# Patient Record
Sex: Female | Born: 1935 | Race: White | Hispanic: No | Smoking: Former smoker
Health system: Southern US, Community
[De-identification: ages and names within clinical notes are randomized; demographics above are authoritative.]

## PROBLEM LIST (undated history)

## (undated) DIAGNOSIS — R51 Headache: Secondary | ICD-10-CM

## (undated) DIAGNOSIS — M1990 Unspecified osteoarthritis, unspecified site: Secondary | ICD-10-CM

## (undated) DIAGNOSIS — I10 Essential (primary) hypertension: Secondary | ICD-10-CM

## (undated) DIAGNOSIS — R42 Dizziness and giddiness: Secondary | ICD-10-CM

## (undated) DIAGNOSIS — I714 Abdominal aortic aneurysm, without rupture, unspecified: Secondary | ICD-10-CM

## (undated) DIAGNOSIS — D689 Coagulation defect, unspecified: Secondary | ICD-10-CM

## (undated) DIAGNOSIS — E785 Hyperlipidemia, unspecified: Secondary | ICD-10-CM

## (undated) DIAGNOSIS — R630 Anorexia: Secondary | ICD-10-CM

## (undated) DIAGNOSIS — R197 Diarrhea, unspecified: Secondary | ICD-10-CM

## (undated) DIAGNOSIS — H269 Unspecified cataract: Secondary | ICD-10-CM

## (undated) HISTORY — DX: Hyperlipidemia, unspecified: E78.5

## (undated) HISTORY — DX: Coagulation defect, unspecified: D68.9

## (undated) HISTORY — DX: Abdominal aortic aneurysm, without rupture: I71.4

## (undated) HISTORY — DX: Unspecified cataract: H26.9

## (undated) HISTORY — DX: Unspecified osteoarthritis, unspecified site: M19.90

## (undated) HISTORY — PX: PR VEIN BYPASS GRAFT,AORTO-FEM-POP: 35551

## (undated) HISTORY — DX: Dizziness and giddiness: R42

## (undated) HISTORY — DX: Headache: R51

## (undated) HISTORY — DX: Anorexia: R63.0

## (undated) HISTORY — DX: Essential (primary) hypertension: I10

## (undated) HISTORY — PX: ARTERIOVENOUS GRAFT PLACEMENT: SUR1029

## (undated) HISTORY — DX: Abdominal aortic aneurysm, without rupture, unspecified: I71.40

## (undated) HISTORY — DX: Diarrhea, unspecified: R19.7

---

## 2010-07-09 ENCOUNTER — Inpatient Hospital Stay (HOSPITAL_COMMUNITY): Admission: EM | Admit: 2010-07-09 | Discharge: 2010-07-27 | Payer: Self-pay | Admitting: Emergency Medicine

## 2010-07-09 ENCOUNTER — Ambulatory Visit: Payer: Self-pay | Admitting: Cardiothoracic Surgery

## 2010-07-09 ENCOUNTER — Encounter: Payer: Self-pay | Admitting: Cardiothoracic Surgery

## 2010-07-13 ENCOUNTER — Ambulatory Visit: Payer: Self-pay | Admitting: Physical Medicine & Rehabilitation

## 2010-07-18 ENCOUNTER — Encounter: Payer: Self-pay | Admitting: Cardiothoracic Surgery

## 2010-07-28 ENCOUNTER — Emergency Department (HOSPITAL_COMMUNITY)
Admission: EM | Admit: 2010-07-28 | Discharge: 2010-07-28 | Payer: Self-pay | Source: Home / Self Care | Admitting: Emergency Medicine

## 2010-08-01 ENCOUNTER — Inpatient Hospital Stay (HOSPITAL_COMMUNITY): Admission: EM | Admit: 2010-08-01 | Discharge: 2010-08-11 | Payer: Self-pay | Admitting: Emergency Medicine

## 2010-08-01 ENCOUNTER — Ambulatory Visit: Payer: Self-pay | Admitting: Cardiovascular Disease

## 2010-08-03 ENCOUNTER — Encounter: Payer: Self-pay | Admitting: Cardiothoracic Surgery

## 2010-08-04 ENCOUNTER — Ambulatory Visit: Payer: Self-pay | Admitting: Cardiothoracic Surgery

## 2010-08-04 ENCOUNTER — Encounter: Payer: Self-pay | Admitting: Cardiothoracic Surgery

## 2010-08-06 ENCOUNTER — Encounter: Payer: Self-pay | Admitting: Cardiothoracic Surgery

## 2010-08-11 ENCOUNTER — Inpatient Hospital Stay: Admission: AD | Admit: 2010-08-11 | Discharge: 2010-09-05 | Payer: Self-pay | Admitting: Internal Medicine

## 2010-08-14 ENCOUNTER — Encounter (INDEPENDENT_AMBULATORY_CARE_PROVIDER_SITE_OTHER): Payer: Self-pay | Admitting: Internal Medicine

## 2010-09-25 ENCOUNTER — Ambulatory Visit: Payer: Self-pay | Admitting: Surgery

## 2010-09-25 ENCOUNTER — Encounter: Admission: RE | Admit: 2010-09-25 | Discharge: 2010-09-25 | Payer: Self-pay | Admitting: Surgery

## 2010-09-25 DIAGNOSIS — R197 Diarrhea, unspecified: Secondary | ICD-10-CM

## 2010-09-25 DIAGNOSIS — D689 Coagulation defect, unspecified: Secondary | ICD-10-CM

## 2010-09-25 DIAGNOSIS — R42 Dizziness and giddiness: Secondary | ICD-10-CM

## 2010-09-25 DIAGNOSIS — R51 Headache: Secondary | ICD-10-CM

## 2010-09-25 HISTORY — DX: Diarrhea, unspecified: R19.7

## 2010-09-25 HISTORY — DX: Headache: R51

## 2010-09-25 HISTORY — DX: Coagulation defect, unspecified: D68.9

## 2010-09-25 HISTORY — DX: Dizziness and giddiness: R42

## 2010-09-27 ENCOUNTER — Encounter: Admission: RE | Admit: 2010-09-27 | Discharge: 2010-09-27 | Payer: Self-pay | Admitting: Cardiothoracic Surgery

## 2010-09-27 ENCOUNTER — Ambulatory Visit: Payer: Self-pay | Admitting: Cardiothoracic Surgery

## 2010-10-03 ENCOUNTER — Ambulatory Visit: Payer: Self-pay | Admitting: Psychiatry

## 2010-10-07 ENCOUNTER — Ambulatory Visit: Payer: Self-pay | Admitting: Psychiatry

## 2010-10-25 ENCOUNTER — Encounter
Admission: RE | Admit: 2010-10-25 | Discharge: 2010-10-25 | Payer: Self-pay | Source: Home / Self Care | Admitting: Cardiothoracic Surgery

## 2010-10-25 ENCOUNTER — Ambulatory Visit: Payer: Self-pay | Admitting: Cardiothoracic Surgery

## 2010-10-30 ENCOUNTER — Ambulatory Visit: Payer: Self-pay | Admitting: Surgery

## 2010-12-09 ENCOUNTER — Encounter: Payer: Self-pay | Admitting: Surgery

## 2010-12-12 IMAGING — CR DG CHEST 1V PORT
1 series · 1 of 1 positions shown · non-contrast
Comparison: 1 day prior

CLINICAL DATA: Postop for thoracotomy.

PORTABLE CHEST - 1 VIEW

[view not recorded]
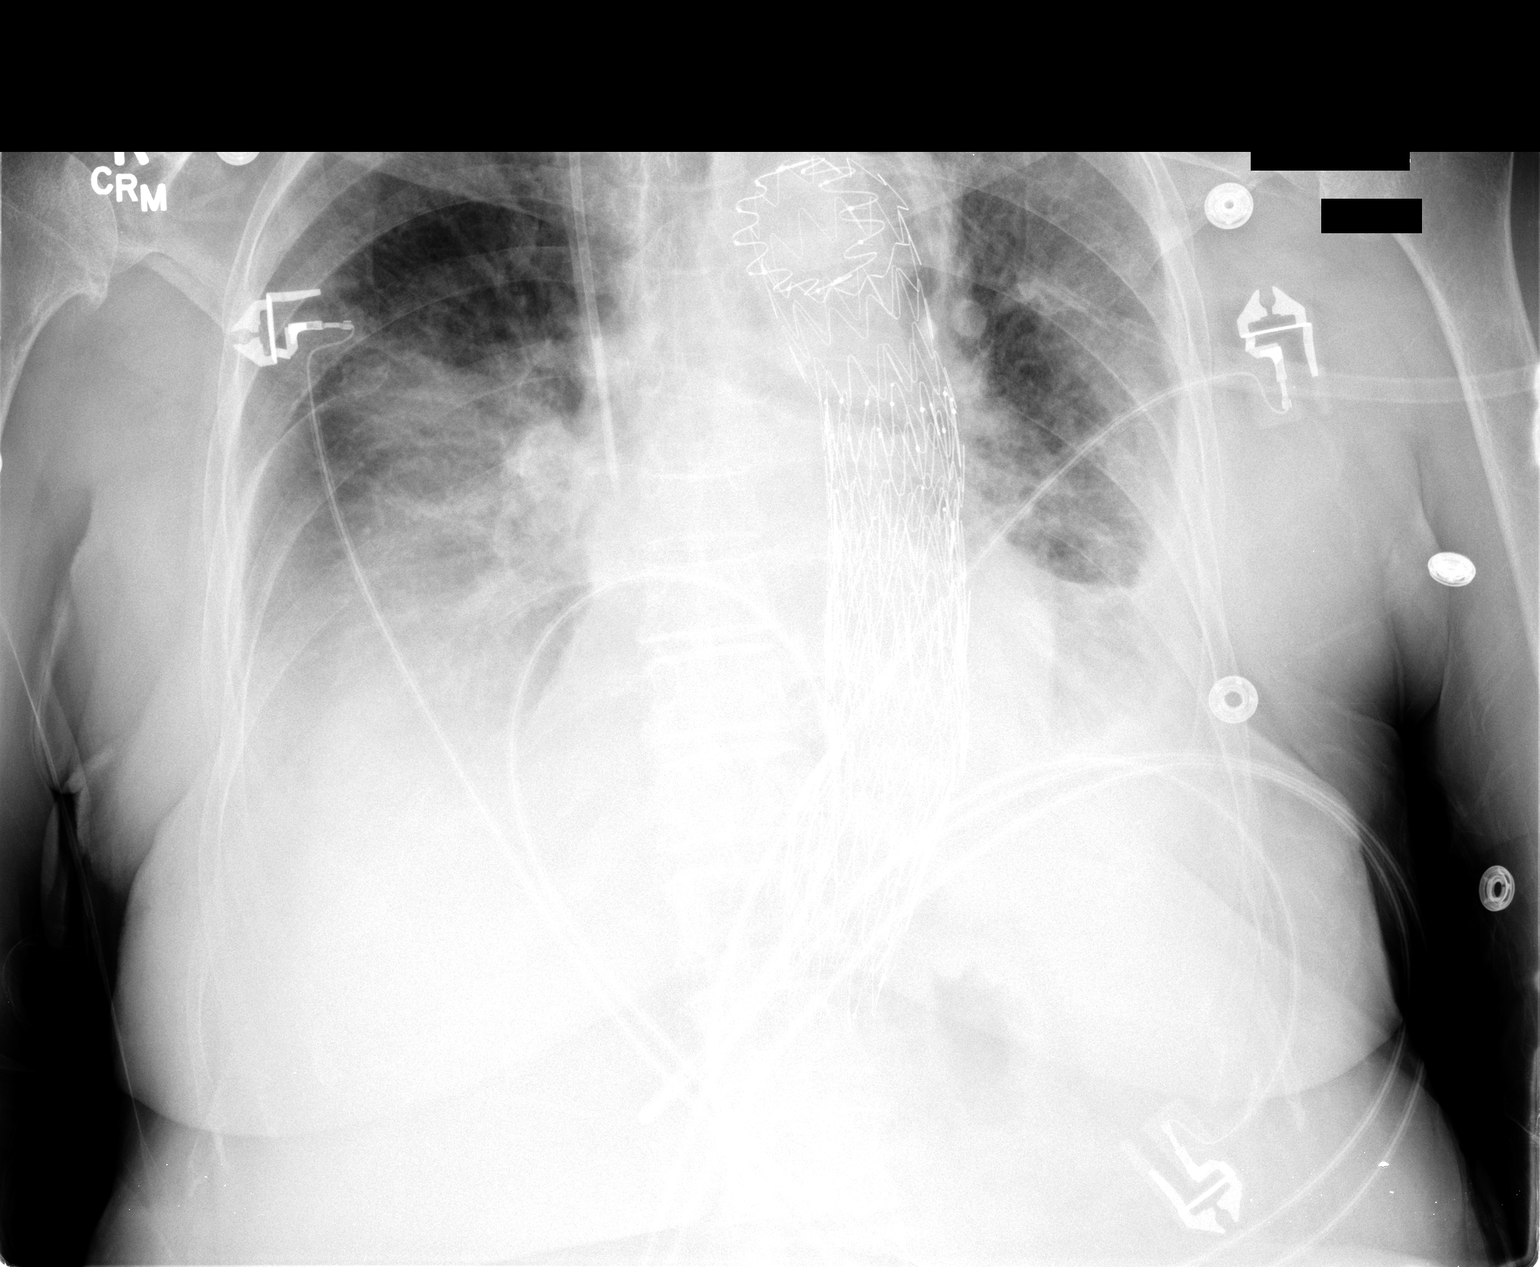

[1 of 1 positions shown; findings below may reference images not displayed]

FINDINGS: A right IJ central line is unchanged.  Descending
thoracic aortic stent graft repair.

The left apex is minimally excluded.  Mild cardiomegaly.  Layering
small bilateral pleural effusions are similar. No pneumothorax.
Asymmetric right greater than left interstitial edema.  Bibasilar
airspace disease. Similar to on the prior exam.
IMPRESSION: 1. No significant change since one day prior.
2.  Congestive heart failure with bilateral pleural effusions and
bibasilar airspace disease, likely atelectasis.

## 2011-01-28 IMAGING — CT CT ANGIO CHEST
2 of 7 series · 14 of 30 positions shown · IV contrast ([ID] OMNI [id number])
Comparison: CT chest of 07/31/2010 and CT angio chest abdomen
pelvis of 07/09/2010

CTA CHEST

Addendum Begins

This study was reviewed by phone with Dr. Nes.  Close  follow-
up CT angio chest may be helpful to be certain that the small area
of higher attenuation just posterior to the distal aortic arch
portion of the stent remains completely stable.
Addendum Ends
CLINICAL DATA: Ruptured thoracic aortic aneurysm, follow-up post
surgical repair
CT ANGIOGRAPHY CHEST, ABDOMEN AND PELVIS
TECHNIQUE: Multidetector CT imaging through the chest, abdomen and
pelvis was performed using the standard protocol during bolus
administration of intravenous contrast.  Multiplanar reconstructed
images including MIPs were obtained and reviewed to evaluate the
vascular anatomy.
Contrast: 100 ml 2mnipaque-MVV

[Series 5: angio · axial · 0.66mm/px · z∈[-568,-73]mm · 10 of 303 slices shown]
[im 28/303  lung]
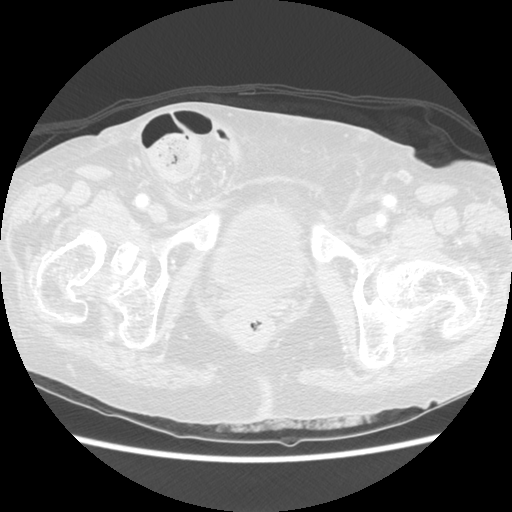
[im 55/303  mediastinal]
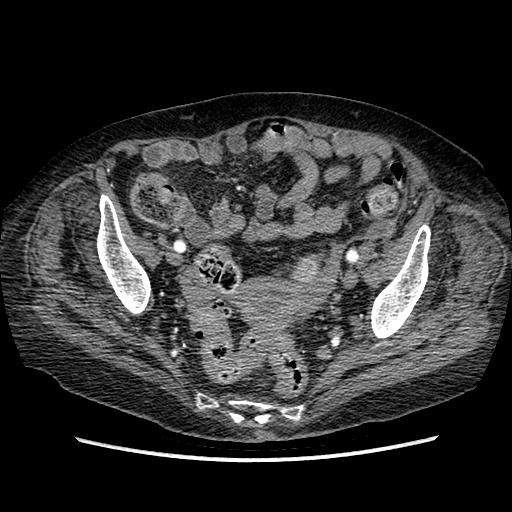
[im 83/303  lung]
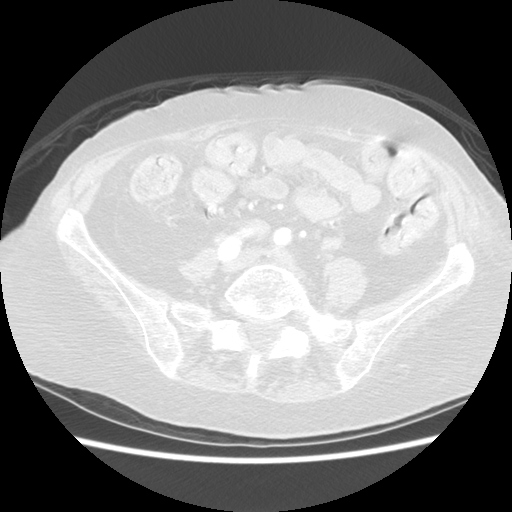
[im 110/303  mediastinal]
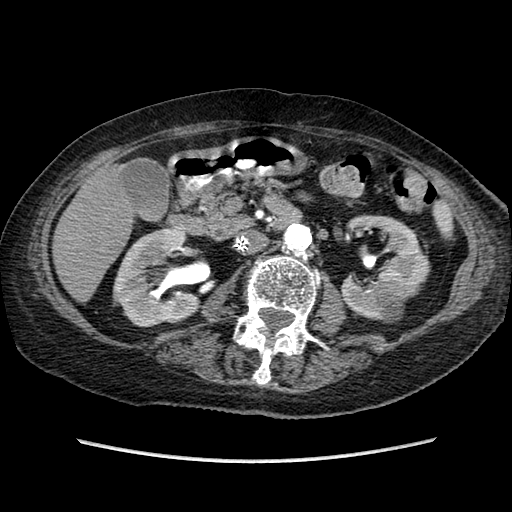
[im 138/303  lung]
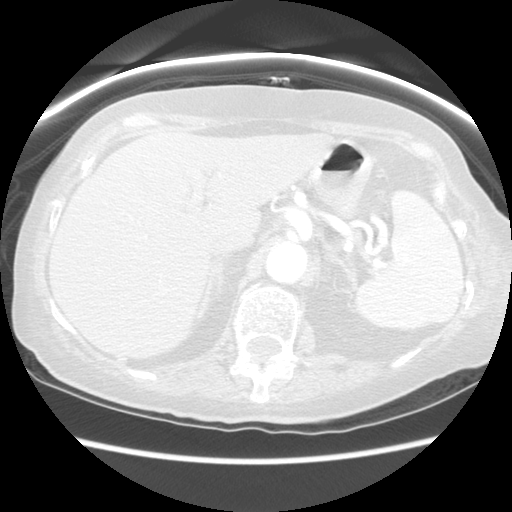
[im 165/303  mediastinal]
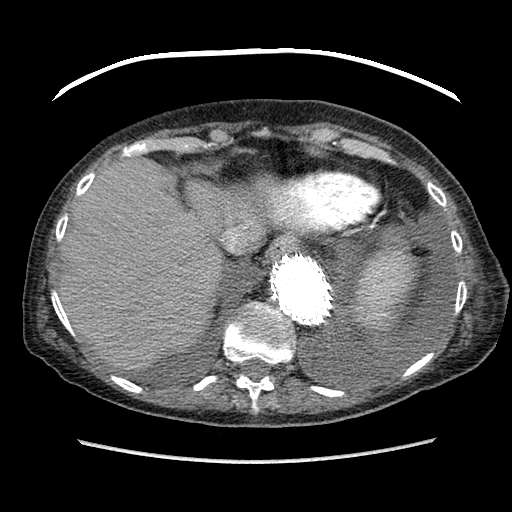
[im 193/303  lung]
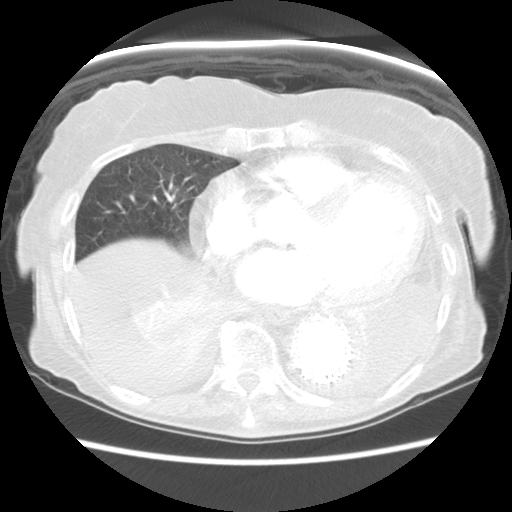
[im 220/303  mediastinal]
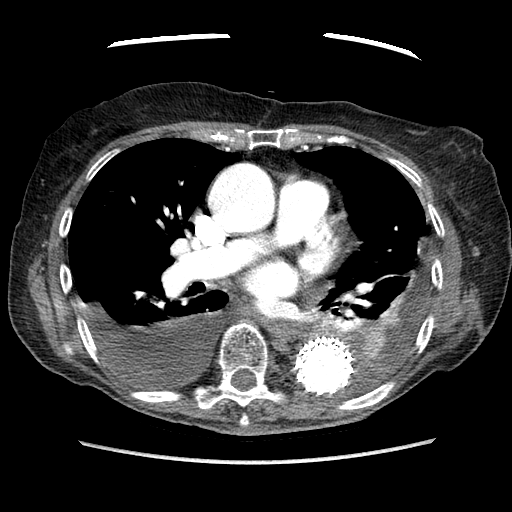
[im 248/303  lung]
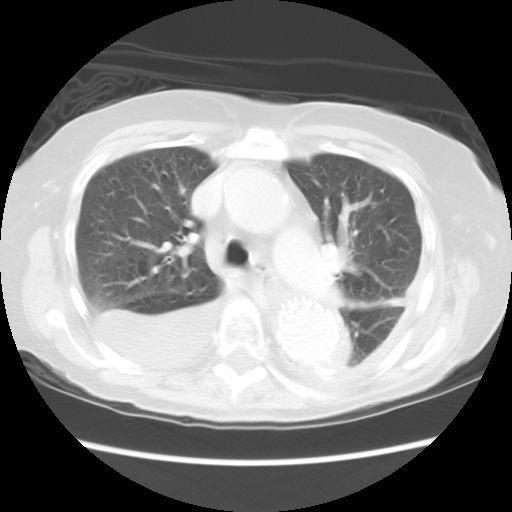
[im 275/303  mediastinal]
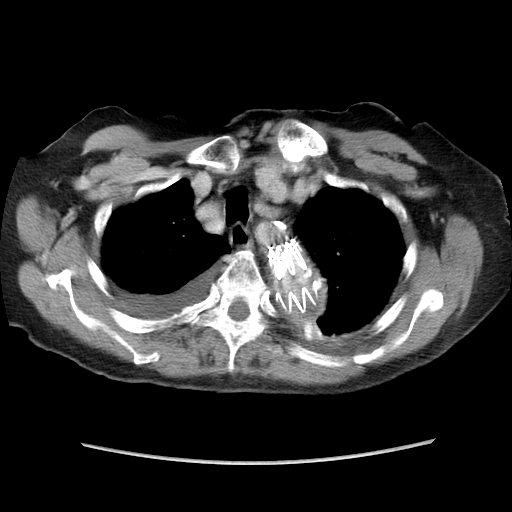

[Series 605: sagittal body · sagittal · 0.66mm/px · 4 of 137 slices shown]
[im 28/137  lung]
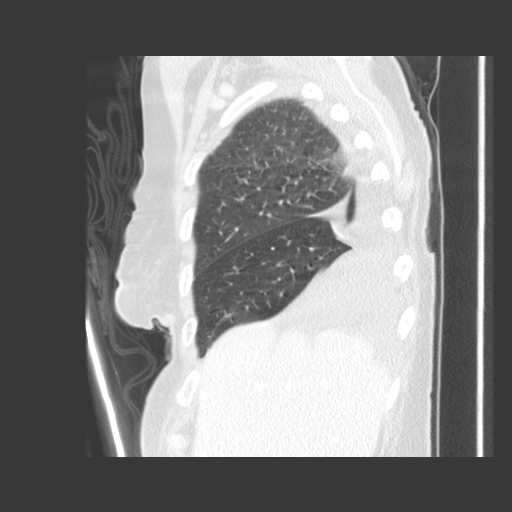
[im 55/137  lung]
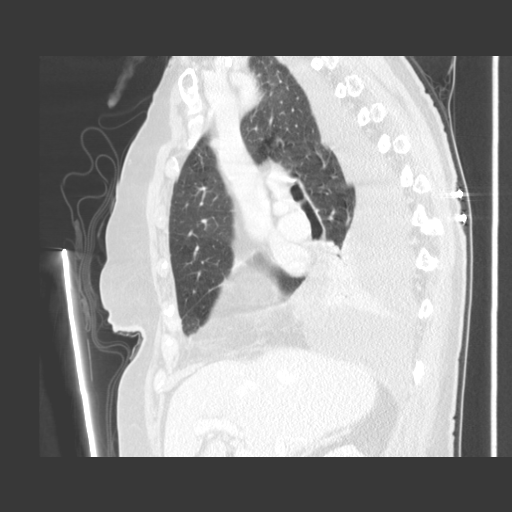
[im 82/137  lung]
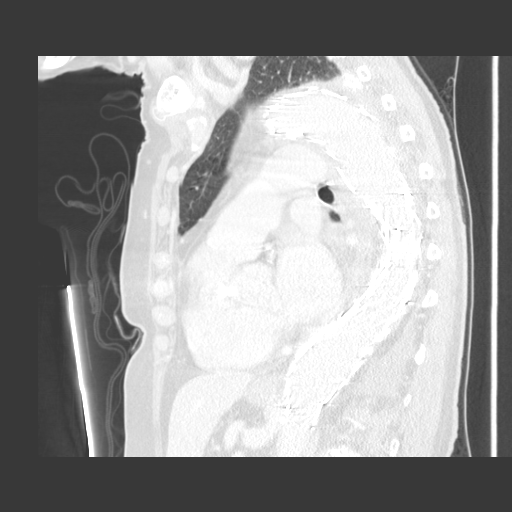
[im 109/137  lung]
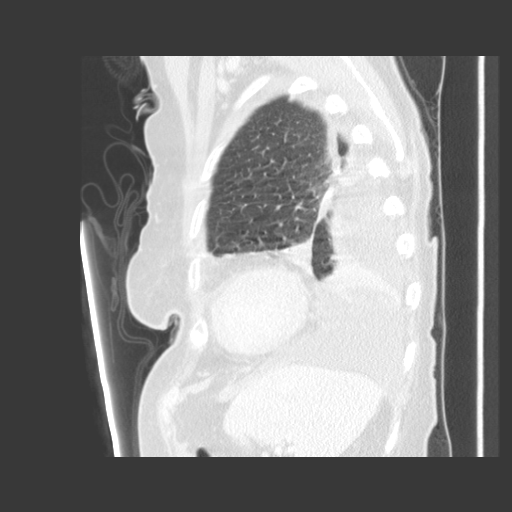

[14 of 30 positions shown; findings below may reference images not displayed]

FINDINGS: There is no change in position of the thoracic aortic
stent which extends  from a point just beyond the takeoff of the
left subclavian artery throughout the entire descending thoracic
aorta to the level of the diaphragmatic crus.  On unenhanced images
there is higher attenuation posterior to the distal thoracic aortic
arch which appears new compared to the CT of 07/31/2010, but does
not change after contrast administration, consistent with interval
calcification.  This higher attenuation area does not increase
after contrast enhancement and therefore there is no evidence of
endovascular leak.  The lumen of the stent opacifies well with no
significant thrombus formation.  The origins of the great vessels
are patent.  Bilateral pleural effusions are again noted of
moderate size with bibasilar atelectasis involving the lower lobes
remaining.  Cardiomegaly is stable.  The pulmonary arteries opacify
with no significant abnormality noted.  No mediastinal or hilar
adenopathy is seen.  Enlarged left lobe of thyroid is stable.

On the lung window images it does appear that the left effusion has
diminished in volume, and there is improved aeration of the left
lower lobe.  The right effusion has not changed significantly nor
has the volume loss at the right lung base.  No new lung
parenchymal abnormality is seen.

 Review of the MIP images confirms the above findings.
IMPRESSION: 1.  There is no change in appearance of the thoracic aortic stent.
No endovascular leak is seen.  There is higher attenuation
posterior to the distal arch which is new but does not change
between unenhanced and enhanced images, and therefore is consistent
with some interval calcification.
2.  Some decrease in volume of the left pleural effusion although
there are moderate bilateral effusions remaining with basilar
atelectasis.

CTA ABDOMEN
FINDINGS: The thoracic aortic stent terminates near the level of
the diaphragmatic crus, just above the origin of the celiac axis.
The origin of the celiac axis, SMA, renal arteries and IMA are
patent.  No abdominal aortic aneurysm is seen although there are
atheromatous changes throughout the abdominal aorta.

The liver enhances with no significant abnormality and no ductal
dilatation is seen.  No calcified gallstones are noted.  The
pancreas is normal in size and the pancreatic duct is not dilated.
The adrenal glands and spleen are unremarkable.  The stomach is not
well distended.  The kidneys enhance with no significant
abnormality and a parapelvic left renal cyst appears slightly
smaller.  No adenopathy is seen.

 Review of the MIP images confirms the above findings.
IMPRESSION: Atheromatous changes noted throughout the abdominal aorta.  No
abdominal aortic aneurysm is seen.

CTA PELVIS
FINDINGS: The iliac arteries opacify with no significant
abnormality.  No aneurysm is seen.  The internal and external iliac
arteries are patent.  The urinary bladder is unremarkable.  A right
inguinal hernia is present extending toward the scrotum containing
the cecum and nondilated small bowel.  Rectosigmoid colonic
diverticula are present.  A small amount of fluid is noted in the
vagina and there may be a small amount of fluid in the endometrial
cavity.  Clinical correlation is recommended.  No definite free
fluid is seen within the pelvis.  Significant degenerative joint
disease is noted within the right hip with lesser degenerative
change in the left hip.  The bones are diffusely osteopenic.

 Review of the MIP images confirms the above findings.
IMPRESSION: 1.  No aneurysm of the common iliac arteries, internal or external
iliac arteries is seen.
2.  Right inguinal hernia extending to the scrotum containing
nondilated cecum and small bowel.
3.  Rectosigmoid colonic diverticula.
4.  Some fluid in the vagina and endometrial cavity of questionable
significance.  Correlate clinically.
5.  Significant degenerative change in the hips right greater than
left.

## 2011-01-30 IMAGING — CR DG CHEST 2V
2 series · 2 of 2 positions shown · non-contrast
Comparison: Chest x-ray of 09/02/2010

CLINICAL DATA: Status post left VATS, left effusion, follow-up

CHEST - 2 VIEW

[view not recorded (1 of 2)]
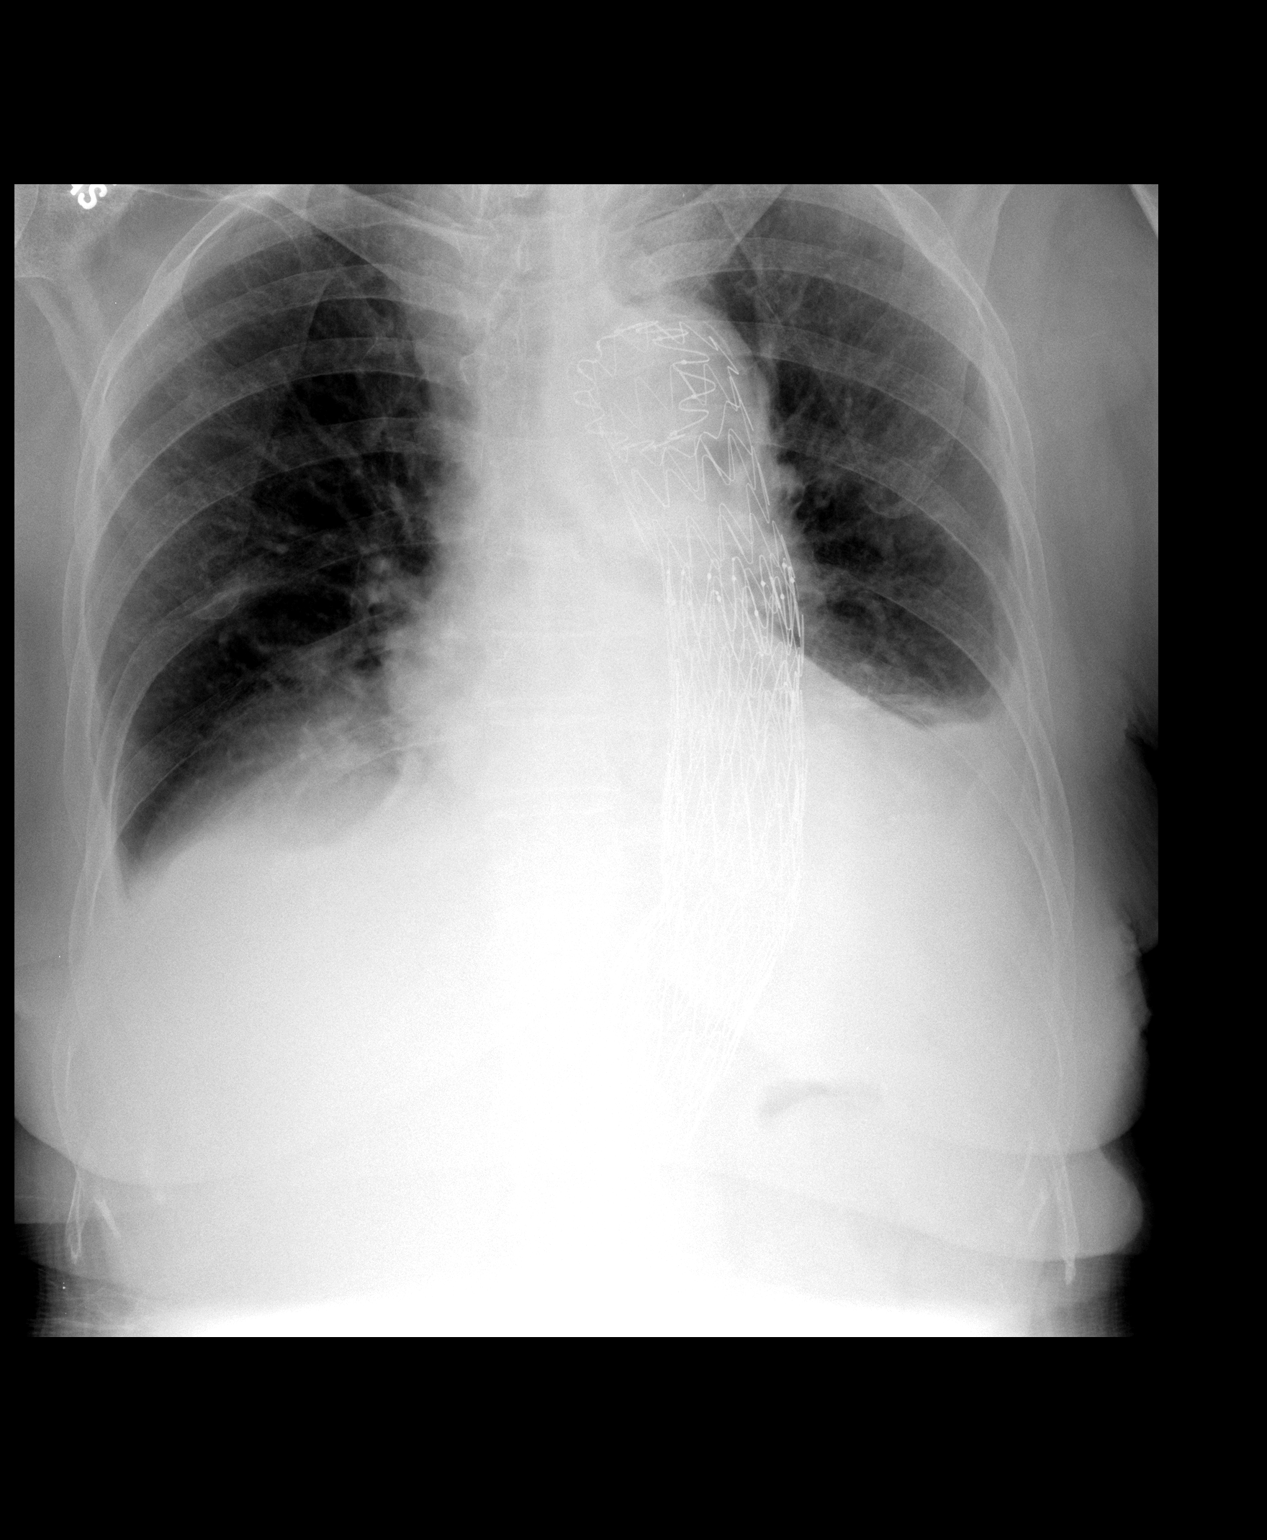

[view not recorded (2 of 2)]
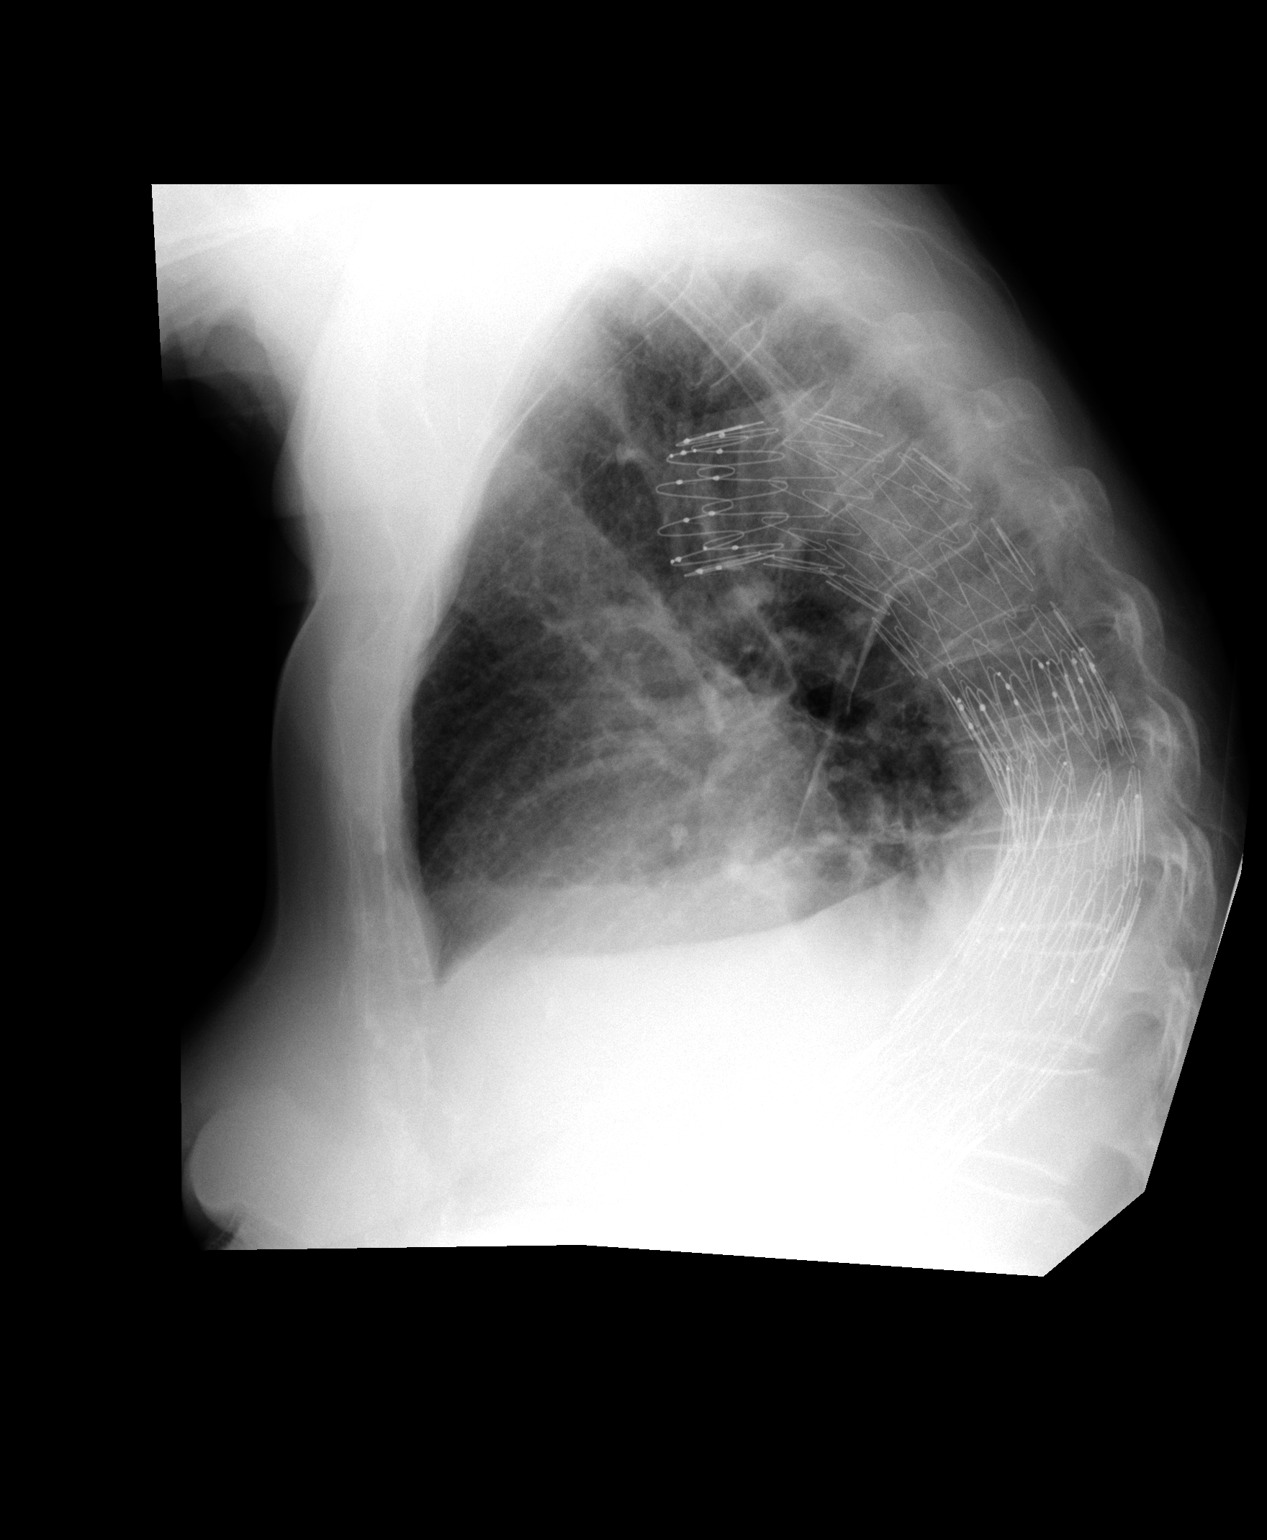

[2 of 2 positions shown; findings below may reference images not displayed]

FINDINGS: Aeration has improved.  Bibasilar opacities remain
consistent with decreasing effusions and basilar atelectasis.
Cardiomegaly is stable.  The thoracic aortic stent graft is
unchanged in position extending throughout the entire length of the
descending thoracic aorta.
IMPRESSION: Improved aeration.  Persistent basilar atelectasis and effusions.

## 2011-01-31 LAB — CBC
MCV: 85.3 fL (ref 78.0–100.0)
Platelets: 242 10*3/uL (ref 150–400)
RDW: 17 % — ABNORMAL HIGH (ref 11.5–15.5)
WBC: 4.4 10*3/uL (ref 4.0–10.5)

## 2011-01-31 LAB — DIFFERENTIAL
Basophils Absolute: 0 10*3/uL (ref 0.0–0.1)
Eosinophils Relative: 8 % — ABNORMAL HIGH (ref 0–5)
Lymphocytes Relative: 28 % (ref 12–46)
Neutro Abs: 2.4 10*3/uL (ref 1.7–7.7)

## 2011-01-31 LAB — PROTIME-INR
INR: 1.12 (ref 0.00–1.49)
INR: 1.16 (ref 0.00–1.49)
INR: 1.4 (ref 0.00–1.49)
Prothrombin Time: 14.6 seconds (ref 11.6–15.2)
Prothrombin Time: 17.4 s — ABNORMAL HIGH (ref 11.6–15.2)
Prothrombin Time: 17.5 seconds — ABNORMAL HIGH (ref 11.6–15.2)

## 2011-01-31 LAB — PREALBUMIN: Prealbumin: 17.6 mg/dL — ABNORMAL LOW (ref 18.0–45.0)

## 2011-02-01 LAB — CBC
HCT: 29.9 % — ABNORMAL LOW (ref 36.0–46.0)
HCT: 30.4 % — ABNORMAL LOW (ref 36.0–46.0)
HCT: 31.1 % — ABNORMAL LOW (ref 36.0–46.0)
HCT: 25.9 % — ABNORMAL LOW (ref 36.0–46.0)
HCT: 27.4 % — ABNORMAL LOW (ref 36.0–46.0)
HCT: 29.1 % — ABNORMAL LOW (ref 36.0–46.0)
HCT: 30.7 % — ABNORMAL LOW (ref 36.0–46.0)
HCT: 31.9 % — ABNORMAL LOW (ref 36.0–46.0)
HCT: 25.7 % — ABNORMAL LOW (ref 36.0–46.0)
HCT: 26.4 % — ABNORMAL LOW (ref 36.0–46.0)
HCT: 27.7 % — ABNORMAL LOW (ref 36.0–46.0)
HCT: 31 % — ABNORMAL LOW (ref 36.0–46.0)
HCT: 34 % — ABNORMAL LOW (ref 36.0–46.0)
Hemoglobin: 9.2 g/dL — ABNORMAL LOW (ref 12.0–15.0)
Hemoglobin: 9.5 g/dL — ABNORMAL LOW (ref 12.0–15.0)
Hemoglobin: 8.2 g/dL — ABNORMAL LOW (ref 12.0–15.0)
Hemoglobin: 8.6 g/dL — ABNORMAL LOW (ref 12.0–15.0)
Hemoglobin: 8.7 g/dL — ABNORMAL LOW (ref 12.0–15.0)
Hemoglobin: 9 g/dL — ABNORMAL LOW (ref 12.0–15.0)
Hemoglobin: 9.2 g/dL — ABNORMAL LOW (ref 12.0–15.0)
Hemoglobin: 9.4 g/dL — ABNORMAL LOW (ref 12.0–15.0)
Hemoglobin: 10.5 g/dL — ABNORMAL LOW (ref 12.0–15.0)
Hemoglobin: 8.4 g/dL — ABNORMAL LOW (ref 12.0–15.0)
Hemoglobin: 9 g/dL — ABNORMAL LOW (ref 12.0–15.0)
Hemoglobin: 9.6 g/dL — ABNORMAL LOW (ref 12.0–15.0)
MCH: 25.7 pg — ABNORMAL LOW (ref 26.0–34.0)
MCH: 26.5 pg (ref 26.0–34.0)
MCH: 25.9 pg — ABNORMAL LOW (ref 26.0–34.0)
MCH: 26.3 pg (ref 26.0–34.0)
MCH: 26.5 pg (ref 26.0–34.0)
MCH: 26.8 pg (ref 26.0–34.0)
MCH: 26.8 pg (ref 26.0–34.0)
MCH: 26.9 pg (ref 26.0–34.0)
MCH: 27 pg (ref 26.0–34.0)
MCH: 27.1 pg (ref 26.0–34.0)
MCH: 27.9 pg (ref 26.0–34.0)
MCH: 28.4 pg (ref 26.0–34.0)
MCHC: 30.5 g/dL (ref 30.0–36.0)
MCHC: 30.9 g/dL (ref 30.0–36.0)
MCHC: 30.6 g/dL (ref 30.0–36.0)
MCHC: 30.9 g/dL (ref 30.0–36.0)
MCHC: 31.4 g/dL (ref 30.0–36.0)
MCHC: 31.6 g/dL (ref 30.0–36.0)
MCHC: 31.6 g/dL (ref 30.0–36.0)
MCHC: 31.7 g/dL (ref 30.0–36.0)
MCHC: 30.9 g/dL (ref 30.0–36.0)
MCHC: 31 g/dL (ref 30.0–36.0)
MCHC: 31.8 g/dL (ref 30.0–36.0)
MCHC: 31.9 g/dL (ref 30.0–36.0)
MCHC: 32.5 g/dL (ref 30.0–36.0)
MCV: 84.3 fL (ref 78.0–100.0)
MCV: 86.2 fL (ref 78.0–100.0)
MCV: 86.4 fL (ref 78.0–100.0)
MCV: 84.2 fL (ref 78.0–100.0)
MCV: 84.6 fL (ref 78.0–100.0)
MCV: 85.2 fL (ref 78.0–100.0)
MCV: 85.6 fL (ref 78.0–100.0)
MCV: 85.8 fL (ref 78.0–100.0)
MCV: 86 fL (ref 78.0–100.0)
MCV: 87.1 fL (ref 78.0–100.0)
MCV: 87.4 fL (ref 78.0–100.0)
MCV: 87.6 fL (ref 78.0–100.0)
MCV: 87.7 fL (ref 78.0–100.0)
Platelets: 256 10*3/uL (ref 150–400)
Platelets: 266 10*3/uL (ref 150–400)
Platelets: 323 10*3/uL (ref 150–400)
Platelets: 288 10*3/uL (ref 150–400)
Platelets: 297 10*3/uL (ref 150–400)
Platelets: 321 10*3/uL (ref 150–400)
Platelets: 327 10*3/uL (ref 150–400)
Platelets: 351 10*3/uL (ref 150–400)
Platelets: 354 10*3/uL (ref 150–400)
Platelets: 363 10*3/uL (ref 150–400)
Platelets: 369 10*3/uL (ref 150–400)
Platelets: 385 10*3/uL (ref 150–400)
Platelets: 156 10*3/uL (ref 150–400)
Platelets: 336 K/uL (ref 150–400)
Platelets: 354 10*3/uL (ref 150–400)
Platelets: 76 10*3/uL — ABNORMAL LOW (ref 150–400)
RBC: 3.47 MIL/uL — ABNORMAL LOW (ref 3.87–5.11)
RBC: 3.69 MIL/uL — ABNORMAL LOW (ref 3.87–5.11)
RBC: 3.06 MIL/uL — ABNORMAL LOW (ref 3.87–5.11)
RBC: 3.2 MIL/uL — ABNORMAL LOW (ref 3.87–5.11)
RBC: 3.36 MIL/uL — ABNORMAL LOW (ref 3.87–5.11)
RBC: 3.39 MIL/uL — ABNORMAL LOW (ref 3.87–5.11)
RBC: 3.57 MIL/uL — ABNORMAL LOW (ref 3.87–5.11)
RBC: 3.6 MIL/uL — ABNORMAL LOW (ref 3.87–5.11)
RBC: 3.72 MIL/uL — ABNORMAL LOW (ref 3.87–5.11)
RBC: 3.01 MIL/uL — ABNORMAL LOW (ref 3.87–5.11)
RBC: 3.17 MIL/uL — ABNORMAL LOW (ref 3.87–5.11)
RBC: 3.56 MIL/uL — ABNORMAL LOW (ref 3.87–5.11)
RBC: 3.88 MIL/uL (ref 3.87–5.11)
RDW: 17.1 % — ABNORMAL HIGH (ref 11.5–15.5)
RDW: 17.5 % — ABNORMAL HIGH (ref 11.5–15.5)
RDW: 15.9 % — ABNORMAL HIGH (ref 11.5–15.5)
RDW: 16 % — ABNORMAL HIGH (ref 11.5–15.5)
RDW: 16 % — ABNORMAL HIGH (ref 11.5–15.5)
RDW: 16.1 % — ABNORMAL HIGH (ref 11.5–15.5)
RDW: 16.2 % — ABNORMAL HIGH (ref 11.5–15.5)
RDW: 16.4 % — ABNORMAL HIGH (ref 11.5–15.5)
RDW: 16.7 % — ABNORMAL HIGH (ref 11.5–15.5)
RDW: 16.1 % — ABNORMAL HIGH (ref 11.5–15.5)
RDW: 16.1 % — ABNORMAL HIGH (ref 11.5–15.5)
RDW: 16.2 % — ABNORMAL HIGH (ref 11.5–15.5)
RDW: 16.3 % — ABNORMAL HIGH (ref 11.5–15.5)
RDW: 16.3 % — ABNORMAL HIGH (ref 11.5–15.5)
WBC: 4.9 10*3/uL (ref 4.0–10.5)
WBC: 5.2 10*3/uL (ref 4.0–10.5)
WBC: 10.6 10*3/uL — ABNORMAL HIGH (ref 4.0–10.5)
WBC: 11.6 10*3/uL — ABNORMAL HIGH (ref 4.0–10.5)
WBC: 5.9 10*3/uL (ref 4.0–10.5)
WBC: 6.6 10*3/uL (ref 4.0–10.5)
WBC: 7.1 10*3/uL (ref 4.0–10.5)
WBC: 7.6 10*3/uL (ref 4.0–10.5)
WBC: 8.4 10*3/uL (ref 4.0–10.5)
WBC: 8.9 10*3/uL (ref 4.0–10.5)
WBC: 17.3 10*3/uL — ABNORMAL HIGH (ref 4.0–10.5)
WBC: 18.5 10*3/uL — ABNORMAL HIGH (ref 4.0–10.5)
WBC: 9 10*3/uL (ref 4.0–10.5)
WBC: 9.8 K/uL (ref 4.0–10.5)

## 2011-02-01 LAB — URINE CULTURE
Colony Count: NO GROWTH
Culture  Setup Time: 201109011758
Culture: NO GROWTH

## 2011-02-01 LAB — COMPREHENSIVE METABOLIC PANEL
ALT: 12 U/L (ref 0–35)
AST: 13 U/L (ref 0–37)
Albumin: 1.7 g/dL — ABNORMAL LOW (ref 3.5–5.2)
Alkaline Phosphatase: 76 U/L (ref 39–117)
BUN: 20 mg/dL (ref 6–23)
CO2: 32 mEq/L (ref 19–32)
Calcium: 7.9 mg/dL — ABNORMAL LOW (ref 8.4–10.5)
Chloride: 101 mEq/L (ref 96–112)
Creatinine, Ser: 0.48 mg/dL (ref 0.4–1.2)
GFR calc Af Amer: >60 mL/min (ref >60)
GFR calc non Af Amer: >60 mL/min (ref >60)
Glucose, Bld: 121 mg/dL — ABNORMAL HIGH (ref 70–99)
Potassium: 3.3 mEq/L — ABNORMAL LOW (ref 3.5–5.1)
Sodium: 139 mEq/L (ref 135–145)
Total Bilirubin: 0.8 mg/dL (ref 0.3–1.2)
Total Protein: 4.6 g/dL — ABNORMAL LOW (ref 6.0–8.3)

## 2011-02-01 LAB — BASIC METABOLIC PANEL
BUN: 12 mg/dL (ref 6–23)
BUN: 9 mg/dL (ref 6–23)
BUN: 11 mg/dL (ref 6–23)
BUN: 11 mg/dL (ref 6–23)
BUN: 15 mg/dL (ref 6–23)
BUN: 7 mg/dL (ref 6–23)
BUN: 7 mg/dL (ref 6–23)
BUN: 9 mg/dL (ref 6–23)
BUN: 16 mg/dL (ref 6–23)
BUN: 31 mg/dL — ABNORMAL HIGH (ref 6–23)
CO2: 32 mEq/L (ref 19–32)
CO2: 34 mEq/L — ABNORMAL HIGH (ref 19–32)
CO2: 28 mEq/L (ref 19–32)
CO2: 31 mEq/L (ref 19–32)
CO2: 32 mEq/L (ref 19–32)
CO2: 34 mEq/L — ABNORMAL HIGH (ref 19–32)
CO2: 30 mEq/L (ref 19–32)
CO2: 30 mEq/L (ref 19–32)
Calcium: 9 mg/dL (ref 8.4–10.5)
Calcium: 7.2 mg/dL — ABNORMAL LOW (ref 8.4–10.5)
Calcium: 8 mg/dL — ABNORMAL LOW (ref 8.4–10.5)
Calcium: 8.4 mg/dL (ref 8.4–10.5)
Calcium: 8.4 mg/dL (ref 8.4–10.5)
Calcium: 8.5 mg/dL (ref 8.4–10.5)
Calcium: 8.7 mg/dL (ref 8.4–10.5)
Calcium: 8 mg/dL — ABNORMAL LOW (ref 8.4–10.5)
Chloride: 102 mEq/L (ref 96–112)
Chloride: 97 mEq/L (ref 96–112)
Chloride: 94 mEq/L — ABNORMAL LOW (ref 96–112)
Chloride: 95 mEq/L — ABNORMAL LOW (ref 96–112)
Chloride: 95 mEq/L — ABNORMAL LOW (ref 96–112)
Chloride: 99 mEq/L (ref 96–112)
Chloride: 99 mEq/L (ref 96–112)
Chloride: 102 mEq/L (ref 96–112)
Chloride: 94 mEq/L — ABNORMAL LOW (ref 96–112)
Creatinine, Ser: 0.41 mg/dL (ref 0.4–1.2)
Creatinine, Ser: 0.55 mg/dL (ref 0.4–1.2)
Creatinine, Ser: 0.55 mg/dL (ref 0.4–1.2)
Creatinine, Ser: 0.37 mg/dL — ABNORMAL LOW (ref 0.4–1.2)
Creatinine, Ser: 0.37 mg/dL — ABNORMAL LOW (ref 0.4–1.2)
Creatinine, Ser: 0.42 mg/dL (ref 0.4–1.2)
Creatinine, Ser: 0.44 mg/dL (ref 0.4–1.2)
Creatinine, Ser: 0.46 mg/dL (ref 0.4–1.2)
Creatinine, Ser: 0.47 mg/dL (ref 0.4–1.2)
Creatinine, Ser: 0.47 mg/dL (ref 0.4–1.2)
Creatinine, Ser: 0.49 mg/dL (ref 0.4–1.2)
Creatinine, Ser: 0.49 mg/dL (ref 0.4–1.2)
Creatinine, Ser: 0.66 mg/dL (ref 0.4–1.2)
GFR calc Af Amer: >60 mL/min (ref >60)
GFR calc Af Amer: >60 mL/min (ref >60)
GFR calc Af Amer: >60 mL/min (ref >60)
GFR calc Af Amer: >60 mL/min (ref >60)
GFR calc Af Amer: >60 mL/min (ref >60)
GFR calc Af Amer: >60 mL/min (ref >60)
GFR calc Af Amer: >60 mL/min (ref >60)
GFR calc Af Amer: >60 mL/min (ref >60)
GFR calc Af Amer: >60 mL/min (ref >60)
GFR calc Af Amer: >60 mL/min (ref >60)
GFR calc Af Amer: >60 mL/min (ref >60)
GFR calc non Af Amer: >60 mL/min (ref >60)
GFR calc non Af Amer: >60 mL/min (ref >60)
GFR calc non Af Amer: >60 mL/min (ref >60)
GFR calc non Af Amer: >60 mL/min (ref >60)
GFR calc non Af Amer: >60 mL/min (ref >60)
GFR calc non Af Amer: >60 mL/min (ref >60)
GFR calc non Af Amer: >60 mL/min (ref >60)
GFR calc non Af Amer: >60 mL/min (ref >60)
GFR calc non Af Amer: >60 mL/min (ref >60)
GFR calc non Af Amer: >60 mL/min (ref >60)
GFR calc non Af Amer: >60 mL/min (ref >60)
Glucose, Bld: 105 mg/dL — ABNORMAL HIGH (ref 70–99)
Glucose, Bld: 95 mg/dL (ref 70–99)
Glucose, Bld: 106 mg/dL — ABNORMAL HIGH (ref 70–99)
Glucose, Bld: 107 mg/dL — ABNORMAL HIGH (ref 70–99)
Glucose, Bld: 112 mg/dL — ABNORMAL HIGH (ref 70–99)
Glucose, Bld: 114 mg/dL — ABNORMAL HIGH (ref 70–99)
Glucose, Bld: 155 mg/dL — ABNORMAL HIGH (ref 70–99)
Glucose, Bld: 127 mg/dL — ABNORMAL HIGH (ref 70–99)
Glucose, Bld: 133 mg/dL — ABNORMAL HIGH (ref 70–99)
Potassium: 3.7 mEq/L (ref 3.5–5.1)
Potassium: 4 meq/L (ref 3.5–5.1)
Potassium: 3.3 mEq/L — ABNORMAL LOW (ref 3.5–5.1)
Potassium: 3.5 mEq/L (ref 3.5–5.1)
Potassium: 3.6 mEq/L (ref 3.5–5.1)
Potassium: 4.1 mEq/L (ref 3.5–5.1)
Potassium: 3.9 mEq/L (ref 3.5–5.1)
Potassium: 4.5 mEq/L (ref 3.5–5.1)
Sodium: 138 meq/L (ref 135–145)
Sodium: 133 mEq/L — ABNORMAL LOW (ref 135–145)
Sodium: 134 mEq/L — ABNORMAL LOW (ref 135–145)
Sodium: 134 mEq/L — ABNORMAL LOW (ref 135–145)
Sodium: 135 mEq/L (ref 135–145)
Sodium: 136 mEq/L (ref 135–145)
Sodium: 132 mEq/L — ABNORMAL LOW (ref 135–145)
Sodium: 138 mEq/L (ref 135–145)

## 2011-02-01 LAB — DIFFERENTIAL
Basophils Absolute: 0 10*3/uL (ref 0.0–0.1)
Basophils Absolute: 0.1 10*3/uL (ref 0.0–0.1)
Basophils Absolute: 0 K/uL (ref 0.0–0.1)
Basophils Relative: 1 % (ref 0–1)
Basophils Relative: 1 % (ref 0–1)
Basophils Relative: 0 % (ref 0–1)
Eosinophils Absolute: 0.3 10*3/uL (ref 0.0–0.7)
Eosinophils Absolute: 0.1 10*3/uL (ref 0.0–0.7)
Eosinophils Relative: 5 % (ref 0–5)
Eosinophils Relative: 6 % — ABNORMAL HIGH (ref 0–5)
Eosinophils Relative: 1 % (ref 0–5)
Lymphocytes Relative: 29 % (ref 12–46)
Lymphocytes Relative: 34 % (ref 12–46)
Lymphocytes Relative: 10 % — ABNORMAL LOW (ref 12–46)
Lymphs Abs: 1.4 10*3/uL (ref 0.7–4.0)
Lymphs Abs: 1 10*3/uL (ref 0.7–4.0)
Monocytes Absolute: 0.4 10*3/uL (ref 0.1–1.0)
Monocytes Absolute: 0.4 10*3/uL (ref 0.1–1.0)
Monocytes Absolute: 0.6 K/uL (ref 0.1–1.0)
Monocytes Relative: 8 % (ref 3–12)
Monocytes Relative: 6 % (ref 3–12)
Neutro Abs: 2.7 10*3/uL (ref 1.7–7.7)
Neutro Abs: 8.1 K/uL — ABNORMAL HIGH (ref 1.7–7.7)
Neutrophils Relative %: 56 % (ref 43–77)
Neutrophils Relative %: 83 % — ABNORMAL HIGH (ref 43–77)

## 2011-02-01 LAB — TYPE AND SCREEN
ABO/RH(D): A POS
Antibody Screen: NEGATIVE

## 2011-02-01 LAB — ANAEROBIC CULTURE: Gram Stain: NONE SEEN

## 2011-02-01 LAB — POCT I-STAT 4, (NA,K, GLUC, HGB,HCT)
Glucose, Bld: 132 mg/dL — ABNORMAL HIGH (ref 70–99)
HCT: 28 % — ABNORMAL LOW (ref 36.0–46.0)
Hemoglobin: 9.5 g/dL — ABNORMAL LOW (ref 12.0–15.0)
Potassium: 3.4 mEq/L — ABNORMAL LOW (ref 3.5–5.1)
Sodium: 136 mEq/L (ref 135–145)

## 2011-02-01 LAB — URINALYSIS, ROUTINE W REFLEX MICROSCOPIC
Bilirubin Urine: NEGATIVE
Glucose, UA: NEGATIVE mg/dL
Hgb urine dipstick: NEGATIVE
Ketones, ur: 15 mg/dL — AB
Nitrite: NEGATIVE
Protein, ur: NEGATIVE mg/dL
Specific Gravity, Urine: 1.025 (ref 1.005–1.030)
Urobilinogen, UA: 2 mg/dL — ABNORMAL HIGH (ref 0.0–1.0)
pH: 6.5 (ref 5.0–8.0)

## 2011-02-01 LAB — BODY FLUID CULTURE
Culture: NO GROWTH
Culture: NO GROWTH
Gram Stain: NONE SEEN
Gram Stain: NONE SEEN

## 2011-02-01 LAB — PROTIME-INR
INR: 1.03 (ref 0.00–1.49)
INR: 1.04 (ref 0.00–1.49)
Prothrombin Time: 14.3 seconds (ref 11.6–15.2)
Prothrombin Time: 13.7 s (ref 11.6–15.2)
Prothrombin Time: 13.8 seconds (ref 11.6–15.2)

## 2011-02-01 LAB — PHOSPHORUS
Phosphorus: 2.8 mg/dL (ref 2.3–4.6)
Phosphorus: 3.4 mg/dL (ref 2.3–4.6)

## 2011-02-01 LAB — HEPATIC FUNCTION PANEL
ALT: 12 U/L (ref 0–35)
AST: 14 U/L (ref 0–37)
Albumin: 1.7 g/dL — ABNORMAL LOW (ref 3.5–5.2)
Alkaline Phosphatase: 116 U/L (ref 39–117)
Bilirubin, Direct: 0.3 mg/dL (ref 0.0–0.3)
Indirect Bilirubin: 0.6 mg/dL (ref 0.3–0.9)
Total Bilirubin: 0.9 mg/dL (ref 0.3–1.2)
Total Protein: 5.1 g/dL — ABNORMAL LOW (ref 6.0–8.3)

## 2011-02-01 LAB — BLOOD GAS, ARTERIAL
Acid-Base Excess: 6.1 mmol/L — ABNORMAL HIGH (ref 0.0–2.0)
Acid-Base Excess: 4.9 mmol/L — ABNORMAL HIGH (ref 0.0–2.0)
Bicarbonate: 30.9 mEq/L — ABNORMAL HIGH (ref 20.0–24.0)
Bicarbonate: 28.6 mEq/L — ABNORMAL HIGH (ref 20.0–24.0)
Drawn by: 273791
Drawn by: 331761
FIO2: 1 %
FIO2: 0.32 %
O2 Saturation: 97.5 %
O2 Saturation: 98.7 %
Patient temperature: 98.6
Patient temperature: 98.6
TCO2: 32.4 mmol/L (ref 0–100)
TCO2: 29.8 mmol/L (ref 0–100)
pCO2 arterial: 51.4 mmHg — ABNORMAL HIGH (ref 35.0–45.0)
pCO2 arterial: 39.8 mmHg (ref 35.0–45.0)
pH, Arterial: 7.396 (ref 7.350–7.400)
pH, Arterial: 7.47 — ABNORMAL HIGH (ref 7.350–7.400)
pO2, Arterial: 95.4 mmHg (ref 80.0–100.0)
pO2, Arterial: 110 mmHg — ABNORMAL HIGH (ref 80.0–100.0)

## 2011-02-01 LAB — BRAIN NATRIURETIC PEPTIDE: Pro B Natriuretic peptide (BNP): 228 pg/mL — ABNORMAL HIGH (ref 0.0–100.0)

## 2011-02-01 LAB — TSH: TSH: 3.275 u[IU]/mL (ref 0.350–4.500)

## 2011-02-01 LAB — URINE MICROSCOPIC-ADD ON

## 2011-02-01 LAB — POCT CARDIAC MARKERS
CKMB, poc: <1 ng/mL — ABNORMAL LOW (ref 1.0–8.0)
Myoglobin, poc: 102 ng/mL (ref 12–200)
Troponin i, poc: <0.05 ng/mL (ref 0.00–0.09)

## 2011-02-01 LAB — BASIC METABOLIC PANEL WITH GFR
BUN: 13 mg/dL (ref 6–23)
Calcium: 8.6 mg/dL (ref 8.4–10.5)
Creatinine, Ser: 0.38 mg/dL — ABNORMAL LOW (ref 0.4–1.2)
GFR calc non Af Amer: >60 mL/min (ref >60)
Glucose, Bld: 107 mg/dL — ABNORMAL HIGH (ref 70–99)
Potassium: 4.4 meq/L (ref 3.5–5.1)

## 2011-02-01 LAB — PREALBUMIN
Prealbumin: 23.9 mg/dL (ref 18.0–45.0)
Prealbumin: 12.9 mg/dL — ABNORMAL LOW (ref 18.0–45.0)

## 2011-02-01 LAB — APTT
aPTT: 31 s (ref 24–37)
aPTT: 35 seconds (ref 24–37)

## 2011-02-01 LAB — MAGNESIUM: Magnesium: 2.2 mg/dL (ref 1.5–2.5)

## 2011-02-01 LAB — GLUCOSE, CAPILLARY: Glucose-Capillary: 136 mg/dL — ABNORMAL HIGH (ref 70–99)

## 2011-02-01 LAB — MRSA PCR SCREENING
MRSA by PCR: NEGATIVE
MRSA by PCR: NEGATIVE
MRSA by PCR: NEGATIVE

## 2011-02-01 LAB — PREPARE RBC (CROSSMATCH)

## 2011-02-01 LAB — VITAMIN D 25 HYDROXY (VIT D DEFICIENCY, FRACTURES): Vit D, 25-Hydroxy: 27 ng/mL — ABNORMAL LOW (ref 30–89)

## 2011-02-02 LAB — CBC
HCT: 23.5 % — ABNORMAL LOW (ref 36.0–46.0)
HCT: 24.3 % — ABNORMAL LOW (ref 36.0–46.0)
HCT: 26.8 % — ABNORMAL LOW (ref 36.0–46.0)
HCT: 27.9 % — ABNORMAL LOW (ref 36.0–46.0)
HCT: 28.4 % — ABNORMAL LOW (ref 36.0–46.0)
HCT: 30.5 % — ABNORMAL LOW (ref 36.0–46.0)
HCT: 31.2 % — ABNORMAL LOW (ref 36.0–46.0)
HCT: 31.8 % — ABNORMAL LOW (ref 36.0–46.0)
HCT: 33.4 % — ABNORMAL LOW (ref 36.0–46.0)
Hemoglobin: 10.2 g/dL — ABNORMAL LOW (ref 12.0–15.0)
Hemoglobin: 10.6 g/dL — ABNORMAL LOW (ref 12.0–15.0)
Hemoglobin: 7.8 g/dL — ABNORMAL LOW (ref 12.0–15.0)
Hemoglobin: 8 g/dL — ABNORMAL LOW (ref 12.0–15.0)
Hemoglobin: 8.9 g/dL — ABNORMAL LOW (ref 12.0–15.0)
Hemoglobin: 9.2 g/dL — ABNORMAL LOW (ref 12.0–15.0)
Hemoglobin: 9.4 g/dL — ABNORMAL LOW (ref 12.0–15.0)
Hemoglobin: 9.7 g/dL — ABNORMAL LOW (ref 12.0–15.0)
Hemoglobin: 9.8 g/dL — ABNORMAL LOW (ref 12.0–15.0)
MCH: 27.8 pg (ref 26.0–34.0)
MCH: 27.8 pg (ref 26.0–34.0)
MCH: 28 pg (ref 26.0–34.0)
MCH: 28.1 pg (ref 26.0–34.0)
MCH: 28.2 pg (ref 26.0–34.0)
MCH: 28.3 pg (ref 26.0–34.0)
MCH: 28.6 pg (ref 26.0–34.0)
MCH: 28.7 pg (ref 26.0–34.0)
MCHC: 32.1 g/dL (ref 30.0–36.0)
MCHC: 32.3 g/dL (ref 30.0–36.0)
MCHC: 32.9 g/dL (ref 30.0–36.0)
MCHC: 33.1 g/dL (ref 30.0–36.0)
MCHC: 33.2 g/dL (ref 30.0–36.0)
MCHC: 33.2 g/dL (ref 30.0–36.0)
MCHC: 33.3 g/dL (ref 30.0–36.0)
MCV: 83.2 fL (ref 78.0–100.0)
MCV: 83.3 fL (ref 78.0–100.0)
MCV: 84.2 fL (ref 78.0–100.0)
MCV: 84.8 fL (ref 78.0–100.0)
MCV: 85.9 fL (ref 78.0–100.0)
MCV: 86.3 fL (ref 78.0–100.0)
MCV: 86.4 fL (ref 78.0–100.0)
MCV: 87.2 fL (ref 78.0–100.0)
MCV: 87.8 fL (ref 78.0–100.0)
MCV: 87.9 fL (ref 78.0–100.0)
MCV: 88.5 fL (ref 78.0–100.0)
Platelets: 124 10*3/uL — ABNORMAL LOW (ref 150–400)
Platelets: 138 10*3/uL — ABNORMAL LOW (ref 150–400)
Platelets: 142 10*3/uL — ABNORMAL LOW (ref 150–400)
Platelets: 173 10*3/uL (ref 150–400)
Platelets: 187 10*3/uL (ref 150–400)
Platelets: 234 10*3/uL (ref 150–400)
Platelets: 83 10*3/uL — ABNORMAL LOW (ref 150–400)
RBC: 2.79 MIL/uL — ABNORMAL LOW (ref 3.87–5.11)
RBC: 2.83 MIL/uL — ABNORMAL LOW (ref 3.87–5.11)
RBC: 3.16 MIL/uL — ABNORMAL LOW (ref 3.87–5.11)
RBC: 3.27 MIL/uL — ABNORMAL LOW (ref 3.87–5.11)
RBC: 3.29 MIL/uL — ABNORMAL LOW (ref 3.87–5.11)
RBC: 3.38 MIL/uL — ABNORMAL LOW (ref 3.87–5.11)
RBC: 3.53 MIL/uL — ABNORMAL LOW (ref 3.87–5.11)
RBC: 3.62 MIL/uL — ABNORMAL LOW (ref 3.87–5.11)
RBC: 4.1 MIL/uL (ref 3.87–5.11)
RDW: 15.8 % — ABNORMAL HIGH (ref 11.5–15.5)
RDW: 15.8 % — ABNORMAL HIGH (ref 11.5–15.5)
RDW: 16 % — ABNORMAL HIGH (ref 11.5–15.5)
RDW: 16.3 % — ABNORMAL HIGH (ref 11.5–15.5)
RDW: 16.5 % — ABNORMAL HIGH (ref 11.5–15.5)
RDW: 16.8 % — ABNORMAL HIGH (ref 11.5–15.5)
RDW: 16.8 % — ABNORMAL HIGH (ref 11.5–15.5)
WBC: 10.4 10*3/uL (ref 4.0–10.5)
WBC: 12.2 10*3/uL — ABNORMAL HIGH (ref 4.0–10.5)
WBC: 12.8 10*3/uL — ABNORMAL HIGH (ref 4.0–10.5)
WBC: 13.3 10*3/uL — ABNORMAL HIGH (ref 4.0–10.5)
WBC: 15.8 10*3/uL — ABNORMAL HIGH (ref 4.0–10.5)
WBC: 16.6 10*3/uL — ABNORMAL HIGH (ref 4.0–10.5)
WBC: 24 10*3/uL — ABNORMAL HIGH (ref 4.0–10.5)
WBC: 8.5 10*3/uL (ref 4.0–10.5)
WBC: 9.5 10*3/uL (ref 4.0–10.5)

## 2011-02-02 LAB — COMPREHENSIVE METABOLIC PANEL
ALT: 19 U/L (ref 0–35)
ALT: 26 U/L (ref 0–35)
AST: 21 U/L (ref 0–37)
AST: 31 U/L (ref 0–37)
Albumin: 1.8 g/dL — ABNORMAL LOW (ref 3.5–5.2)
Albumin: 2.6 g/dL — ABNORMAL LOW (ref 3.5–5.2)
Alkaline Phosphatase: 67 U/L (ref 39–117)
Alkaline Phosphatase: 68 U/L (ref 39–117)
BUN: 11 mg/dL (ref 6–23)
BUN: 13 mg/dL (ref 6–23)
BUN: 16 mg/dL (ref 6–23)
CO2: 23 mEq/L (ref 19–32)
CO2: 24 mEq/L (ref 19–32)
CO2: 27 mEq/L (ref 19–32)
Calcium: 7.2 mg/dL — ABNORMAL LOW (ref 8.4–10.5)
Calcium: 7.9 mg/dL — ABNORMAL LOW (ref 8.4–10.5)
Calcium: 8.4 mg/dL (ref 8.4–10.5)
Chloride: 105 mEq/L (ref 96–112)
Chloride: 106 mEq/L (ref 96–112)
Creatinine, Ser: 0.47 mg/dL (ref 0.4–1.2)
Creatinine, Ser: 0.57 mg/dL (ref 0.4–1.2)
GFR calc Af Amer: >60 mL/min (ref >60)
GFR calc Af Amer: >60 mL/min (ref >60)
GFR calc non Af Amer: >60 mL/min (ref >60)
GFR calc non Af Amer: >60 mL/min (ref >60)
GFR calc non Af Amer: >60 mL/min (ref >60)
Glucose, Bld: 117 mg/dL — ABNORMAL HIGH (ref 70–99)
Glucose, Bld: 140 mg/dL — ABNORMAL HIGH (ref 70–99)
Glucose, Bld: 157 mg/dL — ABNORMAL HIGH (ref 70–99)
Potassium: 3.4 mEq/L — ABNORMAL LOW (ref 3.5–5.1)
Potassium: 3.5 mEq/L (ref 3.5–5.1)
Sodium: 136 mEq/L (ref 135–145)
Sodium: 138 mEq/L (ref 135–145)
Total Bilirubin: 1.3 mg/dL — ABNORMAL HIGH (ref 0.3–1.2)
Total Bilirubin: 2.4 mg/dL — ABNORMAL HIGH (ref 0.3–1.2)
Total Protein: 4.2 g/dL — ABNORMAL LOW (ref 6.0–8.3)
Total Protein: 4.7 g/dL — ABNORMAL LOW (ref 6.0–8.3)
Total Protein: 5.2 g/dL — ABNORMAL LOW (ref 6.0–8.3)

## 2011-02-02 LAB — GLUCOSE, CAPILLARY
Glucose-Capillary: 109 mg/dL — ABNORMAL HIGH (ref 70–99)
Glucose-Capillary: 118 mg/dL — ABNORMAL HIGH (ref 70–99)
Glucose-Capillary: 121 mg/dL — ABNORMAL HIGH (ref 70–99)
Glucose-Capillary: 121 mg/dL — ABNORMAL HIGH (ref 70–99)
Glucose-Capillary: 124 mg/dL — ABNORMAL HIGH (ref 70–99)
Glucose-Capillary: 125 mg/dL — ABNORMAL HIGH (ref 70–99)
Glucose-Capillary: 129 mg/dL — ABNORMAL HIGH (ref 70–99)
Glucose-Capillary: 132 mg/dL — ABNORMAL HIGH (ref 70–99)
Glucose-Capillary: 132 mg/dL — ABNORMAL HIGH (ref 70–99)
Glucose-Capillary: 136 mg/dL — ABNORMAL HIGH (ref 70–99)
Glucose-Capillary: 141 mg/dL — ABNORMAL HIGH (ref 70–99)
Glucose-Capillary: 143 mg/dL — ABNORMAL HIGH (ref 70–99)
Glucose-Capillary: 144 mg/dL — ABNORMAL HIGH (ref 70–99)
Glucose-Capillary: 150 mg/dL — ABNORMAL HIGH (ref 70–99)
Glucose-Capillary: 150 mg/dL — ABNORMAL HIGH (ref 70–99)
Glucose-Capillary: 150 mg/dL — ABNORMAL HIGH (ref 70–99)
Glucose-Capillary: 152 mg/dL — ABNORMAL HIGH (ref 70–99)
Glucose-Capillary: 152 mg/dL — ABNORMAL HIGH (ref 70–99)
Glucose-Capillary: 155 mg/dL — ABNORMAL HIGH (ref 70–99)
Glucose-Capillary: 156 mg/dL — ABNORMAL HIGH (ref 70–99)
Glucose-Capillary: 158 mg/dL — ABNORMAL HIGH (ref 70–99)
Glucose-Capillary: 180 mg/dL — ABNORMAL HIGH (ref 70–99)

## 2011-02-02 LAB — BASIC METABOLIC PANEL
BUN: 10 mg/dL (ref 6–23)
BUN: 12 mg/dL (ref 6–23)
BUN: 13 mg/dL (ref 6–23)
BUN: 15 mg/dL (ref 6–23)
BUN: 18 mg/dL (ref 6–23)
CO2: 26 mEq/L (ref 19–32)
CO2: 30 mEq/L (ref 19–32)
CO2: 30 mEq/L (ref 19–32)
CO2: 30 mEq/L (ref 19–32)
CO2: 31 mEq/L (ref 19–32)
CO2: 32 mEq/L (ref 19–32)
Calcium: 7.3 mg/dL — ABNORMAL LOW (ref 8.4–10.5)
Calcium: 8.1 mg/dL — ABNORMAL LOW (ref 8.4–10.5)
Calcium: 8.1 mg/dL — ABNORMAL LOW (ref 8.4–10.5)
Calcium: 8.2 mg/dL — ABNORMAL LOW (ref 8.4–10.5)
Calcium: 8.3 mg/dL — ABNORMAL LOW (ref 8.4–10.5)
Chloride: 103 mEq/L (ref 96–112)
Chloride: 108 mEq/L (ref 96–112)
Chloride: 95 mEq/L — ABNORMAL LOW (ref 96–112)
Chloride: 96 mEq/L (ref 96–112)
Chloride: 98 mEq/L (ref 96–112)
Chloride: 98 mEq/L (ref 96–112)
Creatinine, Ser: 0.38 mg/dL — ABNORMAL LOW (ref 0.4–1.2)
Creatinine, Ser: 0.45 mg/dL (ref 0.4–1.2)
Creatinine, Ser: 0.52 mg/dL (ref 0.4–1.2)
GFR calc Af Amer: >60 mL/min (ref >60)
GFR calc Af Amer: >60 mL/min (ref >60)
GFR calc Af Amer: >60 mL/min (ref >60)
GFR calc Af Amer: >60 mL/min (ref >60)
GFR calc Af Amer: >60 mL/min (ref >60)
GFR calc non Af Amer: >60 mL/min (ref >60)
GFR calc non Af Amer: >60 mL/min (ref >60)
GFR calc non Af Amer: >60 mL/min (ref >60)
GFR calc non Af Amer: >60 mL/min (ref >60)
GFR calc non Af Amer: >60 mL/min (ref >60)
Glucose, Bld: 108 mg/dL — ABNORMAL HIGH (ref 70–99)
Glucose, Bld: 116 mg/dL — ABNORMAL HIGH (ref 70–99)
Glucose, Bld: 149 mg/dL — ABNORMAL HIGH (ref 70–99)
Glucose, Bld: 153 mg/dL — ABNORMAL HIGH (ref 70–99)
Glucose, Bld: 154 mg/dL — ABNORMAL HIGH (ref 70–99)
Potassium: 3.6 mEq/L (ref 3.5–5.1)
Potassium: 3.7 mEq/L (ref 3.5–5.1)
Potassium: 3.8 mEq/L (ref 3.5–5.1)
Potassium: 3.8 mEq/L (ref 3.5–5.1)
Potassium: 3.8 mEq/L (ref 3.5–5.1)
Potassium: 3.8 mEq/L (ref 3.5–5.1)
Sodium: 133 mEq/L — ABNORMAL LOW (ref 135–145)
Sodium: 135 mEq/L (ref 135–145)
Sodium: 135 mEq/L (ref 135–145)
Sodium: 136 mEq/L (ref 135–145)
Sodium: 137 mEq/L (ref 135–145)
Sodium: 138 mEq/L (ref 135–145)
Sodium: 139 mEq/L (ref 135–145)

## 2011-02-02 LAB — TYPE AND SCREEN
ABO/RH(D): A POS
Antibody Screen: NEGATIVE

## 2011-02-02 LAB — URINE CULTURE
Colony Count: NO GROWTH
Colony Count: NO GROWTH
Culture  Setup Time: 201108211832
Culture  Setup Time: 201108311238
Culture: NO GROWTH
Culture: NO GROWTH

## 2011-02-02 LAB — PREPARE RBC (CROSSMATCH)

## 2011-02-02 LAB — POCT I-STAT, CHEM 8
BUN: 13 mg/dL (ref 6–23)
BUN: 15 mg/dL (ref 6–23)
BUN: 9 mg/dL (ref 6–23)
Calcium, Ion: 1.16 mmol/L (ref 1.12–1.32)
Chloride: 103 mEq/L (ref 96–112)
Chloride: 104 mEq/L (ref 96–112)
Chloride: 83 mEq/L — ABNORMAL LOW (ref 96–112)
Creatinine, Ser: 0.4 mg/dL (ref 0.4–1.2)
Creatinine, Ser: 0.6 mg/dL (ref 0.4–1.2)
Creatinine, Ser: 0.7 mg/dL (ref 0.4–1.2)
Glucose, Bld: 361 mg/dL — ABNORMAL HIGH (ref 70–99)
Sodium: 113 mEq/L — CL (ref 135–145)
Sodium: 138 mEq/L (ref 135–145)
TCO2: 13 mmol/L (ref 0–100)
TCO2: 22 mmol/L (ref 0–100)

## 2011-02-02 LAB — PROTIME-INR
INR: 1.05 (ref 0.00–1.49)
INR: 1.19 (ref 0.00–1.49)
INR: 1.27 (ref 0.00–1.49)
Prothrombin Time: 13.9 seconds (ref 11.6–15.2)
Prothrombin Time: 15.3 seconds — ABNORMAL HIGH (ref 11.6–15.2)
Prothrombin Time: 16.1 seconds — ABNORMAL HIGH (ref 11.6–15.2)

## 2011-02-02 LAB — HEPARIN INDUCED THROMBOCYTOPENIA PNL
UFH High Dose UFH H: 3 % Release
UFH Low Dose 0.1 IU/mL: 94 % Release
UFH Low Dose 0.5 IU/mL: 91 % Release
UFH SRA Result: POSITIVE

## 2011-02-02 LAB — URINALYSIS, ROUTINE W REFLEX MICROSCOPIC
Glucose, UA: NEGATIVE mg/dL
Hgb urine dipstick: NEGATIVE
Ketones, ur: NEGATIVE mg/dL
Ketones, ur: NEGATIVE mg/dL
Leukocytes, UA: NEGATIVE
Nitrite: NEGATIVE
Nitrite: POSITIVE — AB
Protein, ur: 30 mg/dL — AB
Protein, ur: >300 mg/dL — AB
Specific Gravity, Urine: 1.019 (ref 1.005–1.030)
Urobilinogen, UA: 4 mg/dL — ABNORMAL HIGH (ref 0.0–1.0)
pH: 5 (ref 5.0–8.0)

## 2011-02-02 LAB — POCT I-STAT 3, VENOUS BLOOD GAS (G3P V)
Acid-base deficit: 6 mmol/L — ABNORMAL HIGH (ref 0.0–2.0)
Bicarbonate: 22 mEq/L (ref 20.0–24.0)
O2 Saturation: 45 %
TCO2: 24 mmol/L (ref 0–100)
pH, Ven: 7.232 — ABNORMAL LOW (ref 7.250–7.300)

## 2011-02-02 LAB — APTT
aPTT: 33 seconds (ref 24–37)
aPTT: 98 seconds — ABNORMAL HIGH (ref 24–37)

## 2011-02-02 LAB — URINE MICROSCOPIC-ADD ON

## 2011-02-02 LAB — CULTURE, RESPIRATORY W GRAM STAIN: Culture: NORMAL

## 2011-02-02 LAB — CARBOXYHEMOGLOBIN
Carboxyhemoglobin: 1.5 % (ref 0.5–1.5)
Methemoglobin: 0.4 % (ref 0.0–1.5)
O2 Saturation: 62.6 %
Total hemoglobin: 11 g/dL — ABNORMAL LOW (ref 12.5–16.0)

## 2011-02-02 LAB — POCT I-STAT 3, ART BLOOD GAS (G3+)
Acid-base deficit: 3 mmol/L — ABNORMAL HIGH (ref 0.0–2.0)
Bicarbonate: 22 mEq/L (ref 20.0–24.0)
Bicarbonate: 22.9 mEq/L (ref 20.0–24.0)
O2 Saturation: 97 %
Patient temperature: 98.5
TCO2: 24 mmol/L (ref 0–100)
pCO2 arterial: 42.9 mmHg (ref 35.0–45.0)
pH, Arterial: 7.336 — ABNORMAL LOW (ref 7.350–7.400)
pH, Arterial: 7.397 (ref 7.350–7.400)
pO2, Arterial: 87 mmHg (ref 80.0–100.0)
pO2, Arterial: 97 mmHg (ref 80.0–100.0)

## 2011-02-02 LAB — DIFFERENTIAL
Eosinophils Relative: 1 % (ref 0–5)
Lymphocytes Relative: 19 % (ref 12–46)
Lymphs Abs: 4.5 10*3/uL — ABNORMAL HIGH (ref 0.7–4.0)
Monocytes Absolute: 1.5 10*3/uL — ABNORMAL HIGH (ref 0.1–1.0)
Monocytes Relative: 6 % (ref 3–12)
Neutro Abs: 17.8 10*3/uL — ABNORMAL HIGH (ref 1.7–7.7)

## 2011-02-02 LAB — POCT I-STAT 7, (LYTES, BLD GAS, ICA,H+H)
O2 Saturation: 100 %
Patient temperature: 36.8
TCO2: 26 mmol/L (ref 0–100)
pCO2 arterial: 39.2 mmHg (ref 35.0–45.0)
pH, Arterial: 7.408 — ABNORMAL HIGH (ref 7.350–7.400)
pO2, Arterial: 429 mmHg — ABNORMAL HIGH (ref 80.0–100.0)

## 2011-02-02 LAB — MRSA PCR SCREENING
MRSA by PCR: NEGATIVE
MRSA by PCR: NEGATIVE

## 2011-02-02 LAB — POCT CARDIAC MARKERS: CKMB, poc: <1 ng/mL — ABNORMAL LOW (ref 1.0–8.0)

## 2011-02-02 LAB — BLOOD GAS, ARTERIAL
Bicarbonate: 21.9 mEq/L (ref 20.0–24.0)
Patient temperature: 98.6
pH, Arterial: 7.368 (ref 7.350–7.400)
pO2, Arterial: 106 mmHg — ABNORMAL HIGH (ref 80.0–100.0)

## 2011-02-02 LAB — PHOSPHORUS: Phosphorus: 2.9 mg/dL (ref 2.3–4.6)

## 2011-02-02 LAB — POCT I-STAT 4, (NA,K, GLUC, HGB,HCT): HCT: 26 % — ABNORMAL LOW (ref 36.0–46.0)

## 2011-02-02 LAB — ABO/RH: ABO/RH(D): A POS

## 2011-03-06 ENCOUNTER — Other Ambulatory Visit: Payer: Self-pay | Admitting: Surgery

## 2011-03-06 DIAGNOSIS — I712 Thoracic aortic aneurysm, without rupture: Secondary | ICD-10-CM

## 2011-03-06 DIAGNOSIS — R978 Other abnormal tumor markers: Secondary | ICD-10-CM

## 2011-04-03 NOTE — Procedures (Signed)
DUPLEX DEEP VENOUS EXAM - LOWER EXTREMITY   INDICATION:  Thrombus/edema.   HISTORY:  Edema:  Left.  Trauma/Surgery:  AAA repair and femoral artery and popliteal artery  embolectomy on 07/09/2010.  Pain:  Left.  PE:  Previous DVT:  Anticoagulants:  ? Coumadin.  Other:   DUPLEX EXAM:                CFV   SFV   PopV  PTV    GSV                R  L  R  L  R  L  R   L  R  L  Thrombosis       +     +     +      +     o  Spontaneous      P     P     P      o     +  Phasic           P     P     P      o     +  Augmentation     P     P     P      o     +  Compressible     P     P     P      o     +  Competent        P     P     P      o     +   Legend:  + - yes  o - no  p - partial  D - decreased   IMPRESSION:  1. The left leg appears to be positive for deep venous thrombosis.  2. The thrombus appears to be nonacute and is partially compressible      with nonocclusive thrombus noted in the SFJ (saphenofemoral      junction) to popliteal vein.  The posterior tibial vein appears to      be occlusive.     _____________________________  V. Charlena Cross, MD   NT/MEDQ  D:  10/30/2010  T:  10/30/2010  Job:  [id number]

## 2011-04-03 NOTE — Assessment & Plan Note (Signed)
OFFICE VISIT   Dawn Saunders, MORNEAULT  DOB:  Dec 13, 1921                                        October 26, 2010  CHART #:  [id number]   CURRENT PROBLEMS:  1. Status post left video-assisted thoracoscopic surgery decortication      and drainage of recurrent left pleural effusion, September 2011.  2. Reconstruction of a descending thoracic aneurysm with leak with a      endovascular graft - thoracic endovasulcar aortic repair by Dr.      Myra Gianotti, August 2011.  3. Hypertension.  4. Arthritis.   HISTORY OF PRESENT ILLNESS:  The patient is a very nice 77 year old lady  returns for a final office visit for followup of a recurrent left  pleural effusion which required VATS and talc pleurodesis 2 months ago.  She has continued to progress at [facility] and is walking  some with physical therapy but not independently.  Fortunately, her  lower extremity ischemia has resolved and her toe ulcers are almost  completely healed.  She has arthritis in her hip and knee and has  difficulty walking and for that reason is not felt to be a candidate for  independent living.  Her blood pressure has been well controlled and she  has had no recurrent chest pain or shortness of breath.  She is off  oxygen and has no shortness of breath.   PHYSICAL EXAMINATION:  Blood pressure 140/60, pulse 70, respirations 18,  saturation on room air 93%.  She is alert and looks the best I have seen  her in several months.  Breath sounds are clear and equal.  The  thoracotomy incision well healed.  Her cardiac rhythm is regular.  Her  toes are pink and warm and only a slight eschar remains on the tip of  the second left toe.   LABORATORY DATA:  Her chest x-ray shows no recurrent pleural effusion  and her mediastinum is stable.   IMPRESSION AND PLAN:  The patient has recovered well from her life-  saving surgery but still needs nursing home care due to her inability to  walk  independently.  Her pleural effusion has resolved and I told the  patient I felt she could return home once the nursing home releases her.  I would be happy to see her back for further thoracic surgical issues.   Kerin Perna, M.D.  Electronically Signed   PV/MEDQ  D:  10/26/2010  T:  10/27/2010  Job:  [id number]   cc:   Jorge Ny, MD  [facility]

## 2011-04-03 NOTE — Assessment & Plan Note (Signed)
OFFICE VISIT   SAHARA, FUJIMOTO  DOB:  [birth year]                                        September 27, 2010  CHART #:  [id number]   CURRENT PROBLEMS:  1. Status post left VATS and decortication and drainage with talc      pleurodesis of recurrent left pleural effusion, August 04, 2010.  2. Descending thoracic aortic aneurysm with leak, treated with      endovascular stent graft by Dr. Durene Cal on July 09, 2010      (TEVAR).  3. Recurrent postoperative pleural effusion requiring the VATS      drainage and talc pleurodesis September 2011.  4. Hypertension.  5. Arthritis of the left hip.   HISTORY OF PRESENT ILLNESS:  The patient is a very nice 77 year old  frail Caucasian female who is back for first office visit after an  extended hospitalization for the above problems and procedures.  She is  currently living in [facility] and is starting to stand and  walk a few steps.  Overall, she is very weak.  She has had problems with  some thromboemboli to her toes which are now healing and did not require  any major operative intervention.  She is on chronic Coumadin therapy  due to her distal disease without bleeding complications.  She denies  shortness of breath and her chest x-ray today shows no significant  return of the left pleural effusion after the leaking aneurysm and  subsequent endovascular repair.  She was seen as an outpatient by Dr.  Myra Gianotti who had a CT scan performed which showed the stent graft (TEVAR)  to be in good position.  She is still too weak to return home for  independent living.  She does have some mild lower extremity edema.   CURRENT MEDICATIONS:  Amlodipine 5 mg a day, Pepcid 20 mg a day, fish  oil 1 g a day, eye drops, albuterol inhaler q.i.d. p.r.n. warfarin 7.5  mg a day, Lexapro 10 mg a day, lisinopril 10 mg a day, metoprolol 25 mg  b.i.d., MiraLax daily and Remeron 7.5 mg q.h.s.   PHYSICAL EXAMINATION:   Blood pressure 140/60, pulse 70, respirations 18,  saturation 96% on room air.  She is alert and pleasant.  She looks very  well.  Breath sounds are clear and equal.  Cardiac rhythm is regular and  there is no murmur.  She has mild ankle edema but warm feet.  Her toes  are pink and she has some dry clean eschars over the tips of the toes.  The dressing is replaced in the office today.   PA and lateral chest x-ray shows some atelectasis in the left base but  no significant recurrent pleural effusion.  Her cardiac silhouette is  stable and there is no evidence of pneumonia or CHF.   IMPRESSION AND PLAN:  This 77 year old woman is still recovering from  her severe and critical illness, which was treated effectively with  thoracic endovascular aortic repair stent graft to her descending  thoracic aorta and a subsequent left video-assisted thoracoscopic  surgery drainage of the hemothorax.  Her thoracic aortic disease is very  stable and she is gaining strength at [facility].  She needs to remain  at [facility] for another month until she can become stronger and able  walk  and go back to her independent living status at home in [facility].  I will see her back on December 7 with a chest x-ray to  follow up left pleural effusion.   Kerin Perna, M.D.  Electronically Signed   PV/MEDQ  D:  09/27/2010  T:  09/28/2010  Job:  [id number]   cc:   Jorge Ny, MD  [facility]

## 2011-04-03 NOTE — Procedures (Signed)
LOWER EXTREMITY ARTERIAL DUPLEX   INDICATION:  History of femoral and popliteal embolectomy.   HISTORY:  Diabetes:  No  Cardiac:  ?  Hypertension:  Yes  Smoking:  No  Previous Surgery:  AAA repair July 09, 2010   SINGLE LEVEL ARTERIAL EXAM                          RIGHT                LEFT  Brachial:               198                  188  Anterior tibial:        208                  125  Posterior tibial:       223                  153  Peroneal:  Ankle/Brachial Index:   1.13                 0.77   LOWER EXTREMITY ARTERIAL DUPLEX EXAM   DUPLEX:  1. Lower extremity arteries appear to be patent on the left side with      increased velocities of 277 cm/sec noted in the proximal common      femoral artery.  There is a dominant profunda with a branch with      elevated velocities of 392 cm/sec noted.  2. Monophasic waveforms noted throughout lower extremity arteries.   IMPRESSION:  Patent left lower extremity arteries on the left side with  monophasic waveforms noted and increased velocities as noted above.   ___________________________________________  V. Charlena Cross, MD   NT/MEDQ  D:  10/30/2010  T:  10/30/2010  Job:  [id number]

## 2011-04-03 NOTE — Assessment & Plan Note (Signed)
OFFICE VISIT   Dawn Saunders, Dawn Saunders  DOB:  05-31-1922                                       09/25/2010  CHART#:[id number]   REASON FOR VISIT:  Followup.   HISTORY:  This is an 77 year old female who presented on 07/09/2010 with  a ruptured descending thoracic aneurysm.  This was repaired  endovascularly with stent graft.  Her postoperative course was  complicated by pulmonary issues as well as some left foot ischemia which  required a femoral and popliteal embolectomy.  She did develop dusky  appearance of her toes on the left foot.  At the same time, she was also  diagnosed with HIT, and I felt this was most likely a complication from  her HIT.  She has been discharged.  She has required thoracentesis of  her left chest.  She has been discharged to a nursing facility and comes  in today for follow-up.  I did order a CT scan that I have reviewed  myself and with Dr. Allyson Sabal.  I do not feel that there is any evidence of  endoleak.  The aneurysm is fully excluded.  There are some different  densities of contrast within the left chest, but this does not represent  extravasation.   On examination, there is a dry eschar over the tips of her left toes.  There is no evidence of infection.  She is not having significant pain.   At this point, I think she is doing very well from her ruptured thoracic  aorta.  She is having issues with her toes on her left foot.  There is  no evidence of infection right now, and they do appear to be healing,  albeit very slow.  I am a little reluctant to put her through another  procedure such as arteriogram to further evaluate her blood supply, as  she most likely will not have options for revascularization, given the  fact this is most likely related to HIT.  My plan at this point is to  bring her back in 3-4 weeks with an ultrasound to see how her left foot  is progressing.  I would consider an arteriogram if we have not made  significant progress.  I am recommending that we place Silvadene on the  eschar to see if we can get this to slough off.     Juleen China IV, MD  Electronically Signed   VWB/MEDQ  D:  09/25/2010  T:  09/25/2010  Job:  [id number]

## 2011-04-03 NOTE — Assessment & Plan Note (Signed)
OFFICE VISIT   MACON, LESESNE  DOB:  [birth year]                                       10/30/2010  CHART#:[id number]   The patient comes back in today for followup.  She is an 77 year old  female who initially presented on 07/09/2010 with a ruptured descending  thoracic aneurysm.  This was repaired endovascularly with a stent graft.  Her postop course was complicated by pulmonary issues as well as left  foot ischemia which required femoral and popliteal embolectomy.  She  developed HIT and had a dusky appearance of her toes on the left foot.  This has been managed conservatively with wound care as she did develop  eschars on her toes.  The last time I saw her I placed her on Silvadene.  She has had a CT scan which shows successful exclusion of her aneurysm  without evidence of endo leak.  She comes back in today for followup.  She continues to be maintained on Coumadin.  She has had IVC filter  placed.   PHYSICAL EXAMINATION:  Pulmonary:  Her lungs are clear bilaterally.  Cardiovascular:  Regular rate and rhythm.  Pulses are not palpable in  the left foot.  Extremities:  There is a 2 mm eschar remaining on her  left toe.  All of the other areas have healed.  Her toe looks  dramatically improved.  Skin:  Without rash although she does have very  dry skin.   DIAGNOSTIC STUDIES:  Ultrasound shows ABI of 1.1 on the right and 0.77  on the left.  There is increased velocity in the proximal common femoral  artery of 277 cm/sec.  There is a nonacute DVT in the left popliteal  vein.   ASSESSMENT:  Status post endovascular repair of ruptured thoracic  aneurysm.   PLAN:  I think the patient looks dramatically better today.  By her  previous CT scan her aneurysm has been excluded.  She did develop HIT  and had some left toe issues that have dramatically improved since the  last time I saw her.  I think these are going to heal without further  intervention.  She  is going to continue doing her wound care which is  very minimal at this point.  She is going to continue to be maintained  on Coumadin, both for her DVT as well as for her HIT syndrome.  I plan  on seeing her back in the office in 6 months with a CT scan of her  chest.     Jorge Ny, MD  Electronically Signed   VWB/MEDQ  D:  10/30/2010  T:  10/30/2010  Job:  [id number]   cc:   Kerin Perna, M.D.

## 2011-05-07 ENCOUNTER — Ambulatory Visit (INDEPENDENT_AMBULATORY_CARE_PROVIDER_SITE_OTHER): Payer: Medicare Other | Admitting: Surgery

## 2011-05-07 ENCOUNTER — Ambulatory Visit
Admission: RE | Admit: 2011-05-07 | Discharge: 2011-05-07 | Disposition: A | Discharge status: Home / Self Care | Payer: Medicare Other | Source: Ambulatory Visit | Attending: Surgery | Admitting: Surgery

## 2011-05-07 DIAGNOSIS — I712 Thoracic aortic aneurysm, without rupture: Secondary | ICD-10-CM

## 2011-05-07 DIAGNOSIS — I716 Thoracoabdominal aortic aneurysm, without rupture: Secondary | ICD-10-CM

## 2011-05-07 MED ORDER — IOHEXOL 300 MG/ML  SOLN
80.0000 mL | Freq: Once | INTRAMUSCULAR | Status: AC | PRN
  Administered 2011-05-07: 80 mL via INTRAVENOUS

## 2011-05-07 NOTE — Assessment & Plan Note (Signed)
OFFICE VISIT  SAUSHA, RAYMOND DOB:  02-23-1922                                       05/07/2011 CHART#:[id number]  Patient is an 77 year old female who initially presented in 2011 with a ruptured descending thoracic aneurysm.  This was repaired endovascularly with a Cook Zenith device.  She developed left foot ischemia postoperatively, requiring a femoral popliteal embolectomy.  She also developed HIT syndrome and develop a dusky appearance to the toes on her left foot.  She also had a nonacute DVT the last time I saw her in December 2011.  Since then, the eschars on her toes have fallen off, and they do appear to have healed.  She continues to be on Coumadin.  She is back today for a follow-up.  PHYSICAL EXAMINATION:  Heart rate is 87, blood pressure 193/93, O2 sat 97%, respiratory rate 22.  Generally, she is well-appearing in no distress.  Lungs are clear bilaterally.  Cardiovascular:  Regular rate and rhythm.  Extremities are warm and well perfused.  Toes are all normal color.  DIAGNOSTIC STUDIES:  CT scan of the chest was performed today, which shows a type 1 versus type 3 endoleak.  The aneurysm is roughly the same size.  ASSESSMENT/PLAN:  Thoracic aneurysm:  Most likely this represents a type IA endoleak.  I think that in order to have this repaired, she is going to require a proximal extension covering of the left subclavian artery. She needs to have her vertebral arteries evaluated prior to doing this. With this discussion with the patient, she does not wish to have any other operations done. I do not think this is unreasonable given her age.  I would like to see her back in 3 months and rescan her to see if there have been any changes.  With regards to her Coumadin, she was placed on this when she developed HIT and later was found to have a nonacute DVT in December.  She had been on this for over 6 months now.  I think it is reasonable to  stop this.  May also help with resolution of her endoleak.    Jorge Ny, MD Electronically Signed  VWB/MEDQ  D:  05/07/2011  T:  05/07/2011  Job:  [id number]  cc:   L. Lupe Carney, M.D. Kerin Perna, M.D.

## 2011-05-15 ENCOUNTER — Other Ambulatory Visit: Payer: Self-pay | Admitting: Surgery

## 2011-05-15 DIAGNOSIS — IMO0001 Reserved for inherently not codable concepts without codable children: Secondary | ICD-10-CM

## 2011-05-15 DIAGNOSIS — I712 Thoracic aortic aneurysm, without rupture: Secondary | ICD-10-CM

## 2011-07-11 ENCOUNTER — Encounter: Payer: Self-pay | Admitting: Surgery

## 2011-08-09 ENCOUNTER — Other Ambulatory Visit: Payer: Self-pay | Admitting: Surgery

## 2011-08-09 LAB — CREATININE, SERUM: Creat: 0.44 mg/dL — ABNORMAL LOW (ref 0.50–1.10)

## 2011-08-10 ENCOUNTER — Encounter: Payer: Self-pay | Admitting: Surgery

## 2011-08-13 ENCOUNTER — Ambulatory Visit
Admission: RE | Admit: 2011-08-13 | Discharge: 2011-08-13 | Disposition: A | Discharge status: Home / Self Care | Payer: Medicare Other | Source: Ambulatory Visit | Attending: Surgery | Admitting: Surgery

## 2011-08-13 ENCOUNTER — Ambulatory Visit (INDEPENDENT_AMBULATORY_CARE_PROVIDER_SITE_OTHER): Payer: Medicare Other | Admitting: Surgery

## 2011-08-13 ENCOUNTER — Encounter: Payer: Self-pay | Admitting: Surgery

## 2011-08-13 VITALS — BP 178/69 | HR 74 | Resp 16 | Ht 64.0 in | Wt 142.0 lb

## 2011-08-13 DIAGNOSIS — I712 Thoracic aortic aneurysm, without rupture: Secondary | ICD-10-CM

## 2011-08-13 DIAGNOSIS — IMO0001 Reserved for inherently not codable concepts without codable children: Secondary | ICD-10-CM

## 2011-08-13 DIAGNOSIS — I715 Thoracoabdominal aortic aneurysm, ruptured: Secondary | ICD-10-CM

## 2011-08-13 MED ORDER — IOHEXOL 300 MG/ML  SOLN
100.0000 mL | Freq: Once | INTRAMUSCULAR | Status: AC | PRN
  Administered 2011-08-13: 100 mL via INTRAVENOUS

## 2011-08-13 NOTE — Progress Notes (Signed)
Vascular and Vein Specialist of [facility]   Patient name: Dawn Saunders MRN: [id number] DOB: [birth year] Sex: female     Chief Complaint  Patient presents with  . AAA    3 mo followup w/cta    HISTORY OF PRESENT ILLNESS: The patient comes back today for followup of her thoracic aneurysm. She presented in 2011 with a ruptured descending thoracic aneurysm. This was treated using a [employer] device. Postoperatively she developed left leg ischemia requiring femoral-popliteal embolectomy. 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 She also had a nonacute DVT. Her toes substance healed she is no longer taking her Coumadin. I last saw her her CT angiogram revealed evidence of a type IA endoleak. We discussed plans for repair and however be done however the patient did not wish to have any further operations none. She is back today to discuss the findings on her CT angiogram. She has no complaints at this time  Past Medical History  Diagnosis Date  . Hypertension   . Hyperlipidemia   . Loss of appetite   . Dizziness 09/25/2010  . Headache 09/25/2010  . Clotting disorder 09/25/2010  . Diarrhea 09/25/2010  . Arthritis   . Cataracts, bilateral   . AAA (abdominal aortic aneurysm)     Past Surgical History  Procedure Date  . Arteriovenous graft placement   . Pr vein bypass graft,aorto-fem-pop     History   Social History  . Marital Status: Divorced    Spouse Name: N/A    Number of Children: N/A  . Years of Education: N/A   Occupational History  . Not on file.   Social History Main Topics  . Smoking status: Former Smoker -- 2.0 packs/day    Types: Cigarettes  . Smokeless tobacco: Former Neurosurgeon    Quit date: 11/19/1969  . Alcohol Use: No  . Drug Use:   . Sexually Active:    Other Topics Concern  . Not on file   Social History Narrative  . No narrative on file    No family history on file.  Allergies as of 08/13/2011 - Review  Complete 08/13/2011  Allergen Reaction Noted  . Heparin  08/13/2011  . Codeine  07/11/2011  . Iodine  07/11/2011    Current Outpatient Prescriptions on File Prior to Visit  Medication Sig Dispense Refill  . acetaminophen (TYLENOL) 500 MG tablet Take 500 mg by mouth every 6 (six) hours as needed.        Marland Kitchen amLODipine (NORVASC) 10 MG tablet Take 10 mg by mouth daily.        . Cholecalciferol (VITAMIN D3) 1000 UNITS CAPS Take 1 tablet by mouth daily.        . fish oil-omega-3 fatty acids 1000 MG capsule Take 1,000 mg by mouth daily.        Marland Kitchen lisinopril (PRINIVIL,ZESTRIL) 10 MG tablet Take 10 mg by mouth daily.        Marland Kitchen loperamide (IMODIUM A-D) 2 MG tablet Take 2 mg by mouth as needed.        . Multiple Vitamins-Minerals (CENTRUM SILVER PO) Take 1 tablet by mouth daily.        . Multiple Vitamins-Minerals (ICAPS PO) Take 2 capsules by mouth daily.        . pantoprazole (PROTONIX) 40 MG tablet Take 40 mg by mouth daily.        Bertram Gala Glycol-Propyl Glycol (SYSTANE) 0.4-0.3 % GEL Apply 1  drop to eye.        . polyethylene glycol (MIRALAX / GLYCOLAX) packet Take 17 g by mouth daily.        . solifenacin (VESICARE) 10 MG tablet Take 5 mg by mouth daily.        . solifenacin (VESICARE) 5 MG tablet Take 10 mg by mouth daily.        Marland Kitchen warfarin (COUMADIN) 4 MG tablet Take 4 mg by mouth daily. 1 tablet Monday and Thursday. 1 1/2 tablet all other days.        No current facility-administered medications on file prior to visit.     REVIEW OF SYSTEMS: No changes  PHYSICAL EXAMINATION: General: The patient appears their stated age.  Vital signs are BP 178/69  Pulse 74  Resp 16  Ht 5\' 4"  (1.626 m)  Wt 142 lb (64.411 kg)  BMI 24.37 kg/m2 Pulmonary: There is a good air exchange bilaterally without wheezing or rales. Musculoskeletal: There are no major deformities.  There is no significant extremity pain. Neurologic: No focal weakness or paresthesias are detected, Skin: There are no ulcer or  rashes noted. Psychiatric: The patient has normal affect.   Diagnostic Studies CT angiogram shows that the endoleak has resolved. There is no evidence of endoleak on CT scan  Assessment: Status post endovascular repair of the ruptured thoracic aortic aneurysm  Plan: The patient's endoleak has resolved. Most likely this correlates with the cessation of her Coumadin. Overall she is doing very well I'll plan on seeing her back in one year with a repeat CT angiogram. I did tell the patient that she no longer desires any form of surgery that we can hold off on the CT scan. She'll make that decision in one year.  Jorge Ny, M.D. [facility] [place] Office: [phone]

## 2012-01-14 DIAGNOSIS — M159 Polyosteoarthritis, unspecified: Secondary | ICD-10-CM | POA: Diagnosis not present

## 2012-01-14 DIAGNOSIS — R5381 Other malaise: Secondary | ICD-10-CM | POA: Diagnosis not present

## 2012-01-14 DIAGNOSIS — K219 Gastro-esophageal reflux disease without esophagitis: Secondary | ICD-10-CM | POA: Diagnosis not present

## 2012-01-14 DIAGNOSIS — N318 Other neuromuscular dysfunction of bladder: Secondary | ICD-10-CM | POA: Diagnosis not present

## 2012-01-14 DIAGNOSIS — I10 Essential (primary) hypertension: Secondary | ICD-10-CM | POA: Diagnosis not present

## 2012-03-10 ENCOUNTER — Other Ambulatory Visit: Payer: Self-pay | Admitting: *Deleted

## 2012-03-10 DIAGNOSIS — Z8679 Personal history of other diseases of the circulatory system: Secondary | ICD-10-CM

## 2012-03-10 DIAGNOSIS — I7101 Dissection of thoracic aorta: Secondary | ICD-10-CM | POA: Insufficient documentation

## 2012-03-10 DIAGNOSIS — I71012 Dissection of descending thoracic aorta: Secondary | ICD-10-CM

## 2012-07-14 DIAGNOSIS — N318 Other neuromuscular dysfunction of bladder: Secondary | ICD-10-CM | POA: Diagnosis not present

## 2012-07-14 DIAGNOSIS — K219 Gastro-esophageal reflux disease without esophagitis: Secondary | ICD-10-CM | POA: Diagnosis not present

## 2012-07-14 DIAGNOSIS — M159 Polyosteoarthritis, unspecified: Secondary | ICD-10-CM | POA: Diagnosis not present

## 2012-07-14 DIAGNOSIS — R5383 Other fatigue: Secondary | ICD-10-CM | POA: Diagnosis not present

## 2012-07-14 DIAGNOSIS — I10 Essential (primary) hypertension: Secondary | ICD-10-CM | POA: Diagnosis not present

## 2012-07-14 DIAGNOSIS — R5381 Other malaise: Secondary | ICD-10-CM | POA: Diagnosis not present

## 2012-08-18 ENCOUNTER — Ambulatory Visit: Payer: Medicare Other | Admitting: Surgery

## 2012-08-18 ENCOUNTER — Other Ambulatory Visit: Payer: Medicare Other

## 2013-01-14 DIAGNOSIS — I10 Essential (primary) hypertension: Secondary | ICD-10-CM | POA: Diagnosis not present

## 2013-01-14 DIAGNOSIS — K219 Gastro-esophageal reflux disease without esophagitis: Secondary | ICD-10-CM | POA: Diagnosis not present

## 2013-01-14 DIAGNOSIS — N318 Other neuromuscular dysfunction of bladder: Secondary | ICD-10-CM | POA: Diagnosis not present

## 2013-01-14 DIAGNOSIS — R5383 Other fatigue: Secondary | ICD-10-CM | POA: Diagnosis not present

## 2013-01-14 DIAGNOSIS — R809 Proteinuria, unspecified: Secondary | ICD-10-CM | POA: Diagnosis not present

## 2013-01-14 DIAGNOSIS — R5381 Other malaise: Secondary | ICD-10-CM | POA: Diagnosis not present

## 2013-01-14 DIAGNOSIS — M159 Polyosteoarthritis, unspecified: Secondary | ICD-10-CM | POA: Diagnosis not present

## 2013-07-14 DIAGNOSIS — R5381 Other malaise: Secondary | ICD-10-CM | POA: Diagnosis not present

## 2013-07-14 DIAGNOSIS — M159 Polyosteoarthritis, unspecified: Secondary | ICD-10-CM | POA: Diagnosis not present

## 2013-07-14 DIAGNOSIS — N318 Other neuromuscular dysfunction of bladder: Secondary | ICD-10-CM | POA: Diagnosis not present

## 2013-07-14 DIAGNOSIS — I10 Essential (primary) hypertension: Secondary | ICD-10-CM | POA: Diagnosis not present

## 2013-07-14 DIAGNOSIS — R809 Proteinuria, unspecified: Secondary | ICD-10-CM | POA: Diagnosis not present

## 2013-07-15 DIAGNOSIS — N390 Urinary tract infection, site not specified: Secondary | ICD-10-CM | POA: Diagnosis not present

## 2013-07-15 DIAGNOSIS — N318 Other neuromuscular dysfunction of bladder: Secondary | ICD-10-CM | POA: Diagnosis not present

## 2013-07-30 DIAGNOSIS — N390 Urinary tract infection, site not specified: Secondary | ICD-10-CM | POA: Diagnosis not present

## 2014-01-28 DIAGNOSIS — R809 Proteinuria, unspecified: Secondary | ICD-10-CM | POA: Diagnosis not present

## 2014-01-28 DIAGNOSIS — Z23 Encounter for immunization: Secondary | ICD-10-CM | POA: Diagnosis not present

## 2014-01-28 DIAGNOSIS — H6120 Impacted cerumen, unspecified ear: Secondary | ICD-10-CM | POA: Diagnosis not present

## 2014-01-28 DIAGNOSIS — N318 Other neuromuscular dysfunction of bladder: Secondary | ICD-10-CM | POA: Diagnosis not present

## 2014-01-28 DIAGNOSIS — R5383 Other fatigue: Secondary | ICD-10-CM | POA: Diagnosis not present

## 2014-01-28 DIAGNOSIS — I10 Essential (primary) hypertension: Secondary | ICD-10-CM | POA: Diagnosis not present

## 2014-01-28 DIAGNOSIS — M159 Polyosteoarthritis, unspecified: Secondary | ICD-10-CM | POA: Diagnosis not present

## 2014-01-28 DIAGNOSIS — R5381 Other malaise: Secondary | ICD-10-CM | POA: Diagnosis not present

## 2014-08-16 DIAGNOSIS — R5381 Other malaise: Secondary | ICD-10-CM | POA: Diagnosis not present

## 2014-08-16 DIAGNOSIS — R5383 Other fatigue: Secondary | ICD-10-CM | POA: Diagnosis not present

## 2014-08-16 DIAGNOSIS — M159 Polyosteoarthritis, unspecified: Secondary | ICD-10-CM | POA: Diagnosis not present

## 2014-08-16 DIAGNOSIS — N318 Other neuromuscular dysfunction of bladder: Secondary | ICD-10-CM | POA: Diagnosis not present

## 2014-08-16 DIAGNOSIS — I10 Essential (primary) hypertension: Secondary | ICD-10-CM | POA: Diagnosis not present

## 2014-08-16 DIAGNOSIS — R809 Proteinuria, unspecified: Secondary | ICD-10-CM | POA: Diagnosis not present

## 2014-08-16 DIAGNOSIS — Z23 Encounter for immunization: Secondary | ICD-10-CM | POA: Diagnosis not present

## 2014-08-28 ENCOUNTER — Encounter: Payer: Self-pay | Admitting: *Deleted

## 2015-02-14 DIAGNOSIS — R5383 Other fatigue: Secondary | ICD-10-CM | POA: Diagnosis not present

## 2015-02-14 DIAGNOSIS — N3281 Overactive bladder: Secondary | ICD-10-CM | POA: Diagnosis not present

## 2015-02-14 DIAGNOSIS — I10 Essential (primary) hypertension: Secondary | ICD-10-CM | POA: Diagnosis not present

## 2015-02-14 DIAGNOSIS — M150 Primary generalized (osteo)arthritis: Secondary | ICD-10-CM | POA: Diagnosis not present

## 2015-02-14 DIAGNOSIS — K5900 Constipation, unspecified: Secondary | ICD-10-CM | POA: Diagnosis not present

## 2015-02-14 DIAGNOSIS — R809 Proteinuria, unspecified: Secondary | ICD-10-CM | POA: Diagnosis not present

## 2015-06-16 ENCOUNTER — Emergency Department (HOSPITAL_COMMUNITY): Payer: Medicare Other

## 2015-06-16 ENCOUNTER — Encounter (HOSPITAL_COMMUNITY): Payer: Self-pay | Admitting: Emergency Medicine

## 2015-06-16 ENCOUNTER — Emergency Department (HOSPITAL_COMMUNITY)
Admission: EM | Admit: 2015-06-16 | Discharge: 2015-06-16 | Disposition: A | Discharge status: Home / Self Care | Payer: Medicare Other | Attending: Emergency Medicine | Admitting: Emergency Medicine

## 2015-06-16 DIAGNOSIS — M1990 Unspecified osteoarthritis, unspecified site: Secondary | ICD-10-CM | POA: Diagnosis not present

## 2015-06-16 DIAGNOSIS — M7989 Other specified soft tissue disorders: Secondary | ICD-10-CM | POA: Diagnosis not present

## 2015-06-16 DIAGNOSIS — S8991XA Unspecified injury of right lower leg, initial encounter: Secondary | ICD-10-CM | POA: Insufficient documentation

## 2015-06-16 DIAGNOSIS — S0990XA Unspecified injury of head, initial encounter: Secondary | ICD-10-CM | POA: Diagnosis not present

## 2015-06-16 DIAGNOSIS — S098XXA Other specified injuries of head, initial encounter: Secondary | ICD-10-CM | POA: Diagnosis not present

## 2015-06-16 DIAGNOSIS — W1839XA Other fall on same level, initial encounter: Secondary | ICD-10-CM | POA: Diagnosis not present

## 2015-06-16 DIAGNOSIS — Y9389 Activity, other specified: Secondary | ICD-10-CM | POA: Diagnosis not present

## 2015-06-16 DIAGNOSIS — S199XXA Unspecified injury of neck, initial encounter: Secondary | ICD-10-CM | POA: Diagnosis not present

## 2015-06-16 DIAGNOSIS — Z79899 Other long term (current) drug therapy: Secondary | ICD-10-CM | POA: Insufficient documentation

## 2015-06-16 DIAGNOSIS — Z87891 Personal history of nicotine dependence: Secondary | ICD-10-CM | POA: Diagnosis not present

## 2015-06-16 DIAGNOSIS — Z8669 Personal history of other diseases of the nervous system and sense organs: Secondary | ICD-10-CM | POA: Insufficient documentation

## 2015-06-16 DIAGNOSIS — Y92002 Bathroom of unspecified non-institutional (private) residence single-family (private) house as the place of occurrence of the external cause: Secondary | ICD-10-CM | POA: Diagnosis not present

## 2015-06-16 DIAGNOSIS — S0180XA Unspecified open wound of other part of head, initial encounter: Secondary | ICD-10-CM | POA: Diagnosis not present

## 2015-06-16 DIAGNOSIS — Y998 Other external cause status: Secondary | ICD-10-CM | POA: Insufficient documentation

## 2015-06-16 DIAGNOSIS — I10 Essential (primary) hypertension: Secondary | ICD-10-CM | POA: Diagnosis not present

## 2015-06-16 DIAGNOSIS — Z8639 Personal history of other endocrine, nutritional and metabolic disease: Secondary | ICD-10-CM | POA: Diagnosis not present

## 2015-06-16 DIAGNOSIS — S0511XA Contusion of eyeball and orbital tissues, right eye, initial encounter: Secondary | ICD-10-CM | POA: Diagnosis not present

## 2015-06-16 DIAGNOSIS — Z7901 Long term (current) use of anticoagulants: Secondary | ICD-10-CM | POA: Insufficient documentation

## 2015-06-16 DIAGNOSIS — S01111A Laceration without foreign body of right eyelid and periocular area, initial encounter: Secondary | ICD-10-CM | POA: Diagnosis not present

## 2015-06-16 DIAGNOSIS — M25561 Pain in right knee: Secondary | ICD-10-CM | POA: Diagnosis not present

## 2015-06-16 DIAGNOSIS — W19XXXA Unspecified fall, initial encounter: Secondary | ICD-10-CM

## 2015-06-16 DIAGNOSIS — Z862 Personal history of diseases of the blood and blood-forming organs and certain disorders involving the immune mechanism: Secondary | ICD-10-CM | POA: Insufficient documentation

## 2015-06-16 LAB — URINALYSIS, ROUTINE W REFLEX MICROSCOPIC
Bilirubin Urine: NEGATIVE
GLUCOSE, UA: NEGATIVE mg/dL
Hgb urine dipstick: NEGATIVE
Ketones, ur: 15 mg/dL — AB
Nitrite: NEGATIVE
PH: 7 (ref 5.0–8.0)
Protein, ur: NEGATIVE mg/dL
SPECIFIC GRAVITY, URINE: 1.005 (ref 1.005–1.030)
Urobilinogen, UA: 0.2 mg/dL (ref 0.0–1.0)

## 2015-06-16 LAB — URINE MICROSCOPIC-ADD ON

## 2015-06-16 MED ORDER — BACITRACIN 500 UNIT/GM EX OINT
1.0000 "application " | TOPICAL_OINTMENT | Freq: Two times a day (BID) | CUTANEOUS | Status: DC
  Administered 2015-06-16: 1 via TOPICAL

## 2015-06-16 NOTE — ED Provider Notes (Signed)
CSN: [id number]     Arrival date & time 06/16/15  1750 History   First MD Initiated Contact with Patient 06/16/15 2005     Chief Complaint  Patient presents with  . Fall  . Laceration     (Consider location/radiation/quality/duration/timing/severity/associated sxs/prior Treatment) HPI   81 year old female with history of clotting disorder no longer on Coumadin, history of AAA, hypertension, recurrent headache, dizziness brought here via EMS from home for evaluation of a fall. Patient reported approximately 3 hours ago she was walking into the bathroom to wash her hand. When she attempted to wash her hand she lost control fell backward and hitting her forehead against the doorknob. She fell on the ground but denies any loss of consciousness. Her daughter immediately call EMS to bring patient to the ER. Patient denies any precipitating symptoms prior to the fall. She acknowledged that she has a cut on her right eyebrow with minimal pain. She has a bad right knee which he used a walker to ambulate and is concern of knee injury. She reports mild pain to the anterior knee only. She denies any severe headache, vision changes, difficulty thinking, difficult speaking, neck pain, chest pain, shortness of breath, abdominal pain, back pain, or pain to any other extremities. Patient has no other complaint. No lightheadedness or dizziness. She ambulates with a walker. She is up-to-date with tetanus.  Past Medical History  Diagnosis Date  . Hypertension   . Hyperlipidemia   . Loss of appetite   . Dizziness 09/25/2010  . Headache(784.0) 09/25/2010  . Clotting disorder 09/25/2010  . Diarrhea 09/25/2010  . Arthritis   . Cataracts, bilateral   . AAA (abdominal aortic aneurysm)    Past Surgical History  Procedure Laterality Date  . Arteriovenous graft placement    . Pr vein bypass graft,aorto-fem-pop     No family history on file. History  Substance Use Topics  . Smoking status: Former Smoker -- 2.00  packs/day    Types: Cigarettes  . Smokeless tobacco: Former Neurosurgeon    Quit date: 11/19/1969  . Alcohol Use: No   OB History    No data available     Review of Systems  All other systems reviewed and are negative.     Allergies  Heparin; Codeine; and Iodine  Home Medications   Prior to Admission medications   Medication Sig Start Date End Date Taking? Authorizing Provider  acetaminophen (TYLENOL) 500 MG tablet Take 500 mg by mouth every 6 (six) hours as needed.      Historical Provider, MD  amLODipine (NORVASC) 10 MG tablet Take 10 mg by mouth daily.      Historical Provider, MD  Cholecalciferol (VITAMIN D3) 1000 UNITS CAPS Take 1 tablet by mouth daily.      Historical Provider, MD  fish oil-omega-3 fatty acids 1000 MG capsule Take 1,000 mg by mouth daily.      Historical Provider, MD  lisinopril (PRINIVIL,ZESTRIL) 10 MG tablet Take 10 mg by mouth daily.      Historical Provider, MD  loperamide (IMODIUM A-D) 2 MG tablet Take 2 mg by mouth as needed.      Historical Provider, MD  Multiple Vitamins-Minerals (CENTRUM SILVER PO) Take 1 tablet by mouth daily.      Historical Provider, MD  Multiple Vitamins-Minerals (ICAPS PO) Take 2 capsules by mouth daily.      Historical Provider, MD  pantoprazole (PROTONIX) 40 MG tablet Take 40 mg by mouth daily.      Historical Provider,  MD  Polyethyl Glycol-Propyl Glycol (SYSTANE) 0.4-0.3 % GEL Apply 1 drop to eye.      Historical Provider, MD  polyethylene glycol (MIRALAX / GLYCOLAX) packet Take 17 g by mouth daily.      Historical Provider, MD  solifenacin (VESICARE) 10 MG tablet Take 5 mg by mouth daily.      Historical Provider, MD  solifenacin (VESICARE) 5 MG tablet Take 10 mg by mouth daily.      Historical Provider, MD  warfarin (COUMADIN) 4 MG tablet Take 4 mg by mouth daily. 1 tablet Monday and Thursday. 1 1/2 tablet all other days.     Historical Provider, MD   BP 186/76 mmHg  Pulse 81  Temp(Src) 98 F (36.7 C) (Oral)  Resp 20  SpO2  95% Physical Exam  Constitutional: She is oriented to person, place, and time. She appears well-developed and well-nourished. No distress.  Caucasian female appearing stated age in no acute distress.  HENT:  Head: Normocephalic.  Less than 1 cm superficial laceration noted to right eyebrow with dried blood. No crepitus and minimal tenderness. Small ecchymosis noted to right periorbital region.  Eyes: Conjunctivae and EOM are normal. Pupils are equal, round, and reactive to light.  Neck: Normal range of motion. Neck supple.  No cervical midline spine tenderness.  Cardiovascular: Normal rate and regular rhythm.   Pulmonary/Chest: Effort normal and breath sounds normal.  Abdominal: Soft. There is no tenderness.  Musculoskeletal: She exhibits tenderness (Right knee: Mild tenderness to anterior knee with mild crepitus. No joint laxity, no gross deformity, no bruising or swelling. Normal right hip and right ankle.).  Neurological: She is alert and oriented to person, place, and time.  Skin: No rash noted.  Psychiatric: She has a normal mood and affect.  Nursing note and vitals reviewed.   ED Course  Procedures (including critical care time)  Patient presents for evaluation of a mechanical fall with injury to her right eyebrow. She is in no acute distress. Will check UA, orthostatic vital sign, obtain head and neck CT scan due to her age, and x-ray of her right knee.  LACERATION REPAIR Performed by: Fayrene Helper Authorized byFayrene Helper Consent: Verbal consent obtained. Risks and benefits: risks, benefits and alternatives were discussed Consent given by: patient Patient identity confirmed: provided demographic data Prepped and Draped in normal sterile fashion Wound explored  Laceration Location: R eyebrow  Laceration Length: 1cm  No Foreign Bodies seen or palpated  Anesthesia: n/a  Local anesthetic: none  Anesthetic total: na  Irrigation method: syringe Amount of cleaning:  standard  Skin closure: sterile strip  Number of sutures: sterile strip  Technique: sterile strip  Patient tolerance: Patient tolerated the procedure well with no immediate complications.  10:43 PM UA with moderate leukocytes and many bacteria. She denies having any dysuria, urine culture sent. Otherwise her head CT scan, neck CT, and right knee x-ray shows no acute fractures or dislocation. Patient is resting comfortably. She has no other symptoms. She is able to ambulate using a walker. She is stable for discharge. Care discussed with Dr. Micheline Maze  Labs Review Labs Reviewed  URINALYSIS, ROUTINE W REFLEX MICROSCOPIC (NOT AT [facility]) - Abnormal; Notable for the following:    APPearance CLOUDY (*)    Ketones, ur 15 (*)    Leukocytes, UA MODERATE (*)    All other components within normal limits  URINE MICROSCOPIC-ADD ON - Abnormal; Notable for the following:    Bacteria, UA MANY (*)    All other  components within normal limits  URINE CULTURE    Imaging Review Ct Head Wo Contrast  06/16/2015   CLINICAL DATA:  Fall, injury, head and neck trauma  EXAM: CT HEAD WITHOUT CONTRAST  CT CERVICAL SPINE WITHOUT CONTRAST  TECHNIQUE: Multidetector CT imaging of the head and cervical spine was performed following the standard protocol without intravenous contrast. Multiplanar CT image reconstructions of the cervical spine were also generated.  COMPARISON:  None available  FINDINGS: CT HEAD FINDINGS  Age related brain atrophy noted. No acute intracranial hemorrhage, definite infarction, midline shift, herniation, hydrocephalus, or extra-axial fluid collection. Right Anterior parafalcine calcified extra-axial hyperdense mass measures 17 x 16 mm, image 20 compatible with a calcified meningioma. Cisterns are patent. Cerebellar atrophy as well. Orbits are symmetric. Chronic opacification of the left maxillary sinus. Other sinuses and mastoids are clear. Intact skull.  CT CERVICAL SPINE FINDINGS  Diffuse  osteopenia and degenerative changes. Moderately severe spondylosis and degenerative disc disease at C5-6 and C6-7 with disc space narrowing, sclerosis and osteophytes. Minimal anterolisthesis of C4 on C5 appearing degenerative secondary to facet arthropathy. No acute compression fracture, wedge-shaped deformity or focal kyphosis. Normal prevertebral soft tissues. Degenerative changes of the C1-2 articulation. Carotid calcifications noted. Scarring in the lung apices. Asymmetric thyroid enlargement, larger on the left, suspect goiter. Thoracic aortic stent graft partially imaged  IMPRESSION: No acute intracranial finding or hemorrhage.  17 mm hyperdense calcified right anterior parafalcine meningioma.  Cervical spondylosis and degenerative change with osteopenia.  No acute osseous finding or fracture.   Electronically Signed   By: Judie Petit.  Shick M.D.   On: 06/16/2015 21:39   Ct Cervical Spine Wo Contrast  06/16/2015   CLINICAL DATA:  Fall, injury, head and neck trauma  EXAM: CT HEAD WITHOUT CONTRAST  CT CERVICAL SPINE WITHOUT CONTRAST  TECHNIQUE: Multidetector CT imaging of the head and cervical spine was performed following the standard protocol without intravenous contrast. Multiplanar CT image reconstructions of the cervical spine were also generated.  COMPARISON:  None available  FINDINGS: CT HEAD FINDINGS  Age related brain atrophy noted. No acute intracranial hemorrhage, definite infarction, midline shift, herniation, hydrocephalus, or extra-axial fluid collection. Right Anterior parafalcine calcified extra-axial hyperdense mass measures 17 x 16 mm, image 20 compatible with a calcified meningioma. Cisterns are patent. Cerebellar atrophy as well. Orbits are symmetric. Chronic opacification of the left maxillary sinus. Other sinuses and mastoids are clear. Intact skull.  CT CERVICAL SPINE FINDINGS  Diffuse osteopenia and degenerative changes. Moderately severe spondylosis and degenerative disc disease at C5-6 and  C6-7 with disc space narrowing, sclerosis and osteophytes. Minimal anterolisthesis of C4 on C5 appearing degenerative secondary to facet arthropathy. No acute compression fracture, wedge-shaped deformity or focal kyphosis. Normal prevertebral soft tissues. Degenerative changes of the C1-2 articulation. Carotid calcifications noted. Scarring in the lung apices. Asymmetric thyroid enlargement, larger on the left, suspect goiter. Thoracic aortic stent graft partially imaged  IMPRESSION: No acute intracranial finding or hemorrhage.  17 mm hyperdense calcified right anterior parafalcine meningioma.  Cervical spondylosis and degenerative change with osteopenia.  No acute osseous finding or fracture.   Electronically Signed   By: Judie Petit.  Shick M.D.   On: 06/16/2015 21:39   Dg Knee Complete 4 Views Right  06/16/2015   CLINICAL DATA:  Fall in bathroom, chronic right knee pain  EXAM: RIGHT KNEE - COMPLETE 4+ VIEW  COMPARISON:  None.  FINDINGS: Diffuse osteopenia noted. Tricompartmental osteoarthritis with joint space loss and sclerosis. Bones are osteopenic. Mild soft tissue  swelling medially. No definite acute osseous finding or large effusion. Peripheral atherosclerosis noted.  IMPRESSION: Medial soft tissue swelling.  Osteopenia and degenerative changes.  No acute osseous finding  Peripheral atherosclerosis   Electronically Signed   By: Judie Petit.  Shick M.D.   On: 06/16/2015 21:17     EKG Interpretation None      MDM   Final diagnoses:  Fall from standing, initial encounter  Eyebrow laceration, right, initial encounter    BP 148/65 mmHg  Pulse 77  Temp(Src) 98 F (36.7 C) (Oral)  Resp 18  SpO2 97%  I have reviewed nursing notes and vital signs. I personally viewed the imaging tests through PACS system and agrees with radiologist's intepretation I reviewed available ER/hospitalization records through the EMR     Fayrene Helper, PA-C 06/16/15 2248  Toy Cookey, MD 06/17/15 0045

## 2015-06-16 NOTE — Discharge Instructions (Signed)
Fall Prevention and Home Safety °Falls cause injuries and can affect all age groups. It is possible to use preventive measures to significantly decrease the likelihood of falls. There are many simple measures which can make your home safer and prevent falls. °OUTDOORS °· Repair cracks and edges of walkways and driveways. °· Remove high doorway thresholds. °· Trim shrubbery on the main path into your home. °· Have good outside lighting. °· Clear walkways of tools, rocks, debris, and clutter. °· Check that handrails are not broken and are securely fastened. Both sides of steps should have handrails. °· Have leaves, snow, and ice cleared regularly. °· Use sand or salt on walkways during winter months. °· In the garage, clean up grease or oil spills. °BATHROOM °· Install night lights. °· Install grab bars by the toilet and in the tub and shower. °· Use non-skid mats or decals in the tub or shower. °· Place a plastic non-slip stool in the shower to sit on, if needed. °· Keep floors dry and clean up all water on the floor immediately. °· Remove soap buildup in the tub or shower on a regular basis. °· Secure bath mats with non-slip, double-sided rug tape. °· Remove throw rugs and tripping hazards from the floors. °BEDROOMS °· Install night lights. °· Make sure a bedside light is easy to reach. °· Do not use oversized bedding. °· Keep a telephone by your bedside. °· Have a firm chair with side arms to use for getting dressed. °· Remove throw rugs and tripping hazards from the floor. °KITCHEN °· Keep handles on pots and pans turned toward the center of the stove. Use back burners when possible. °· Clean up spills quickly and allow time for drying. °· Avoid walking on wet floors. °· Avoid hot utensils and knives. °· Position shelves so they are not too high or low. °· Place commonly used objects within easy reach. °· If necessary, use a sturdy step stool with a grab bar when reaching. °· Keep electrical cables out of the  way. °· Do not use floor polish or wax that makes floors slippery. If you must use wax, use non-skid floor wax. °· Remove throw rugs and tripping hazards from the floor. °STAIRWAYS °· Never leave objects on stairs. °· Place handrails on both sides of stairways and use them. Fix any loose handrails. Make sure handrails on both sides of the stairways are as long as the stairs. °· Check carpeting to make sure it is firmly attached along stairs. Make repairs to worn or loose carpet promptly. °· Avoid placing throw rugs at the top or bottom of stairways, or properly secure the rug with carpet tape to prevent slippage. Get rid of throw rugs, if possible. °· Have an electrician put in a light switch at the top and bottom of the stairs. °OTHER FALL PREVENTION TIPS °· Wear low-heel or rubber-soled shoes that are supportive and fit well. Wear closed toe shoes. °· When using a stepladder, make sure it is fully opened and both spreaders are firmly locked. Do not climb a closed stepladder. °· Add color or contrast paint or tape to grab bars and handrails in your home. Place contrasting color strips on first and last steps. °· Learn and use mobility aids as needed. Install an electrical emergency response system. °· Turn on lights to avoid dark areas. Replace light bulbs that burn out immediately. Get light switches that glow. °· Arrange furniture to create clear pathways. Keep furniture in the same place. °·   Firmly attach carpet with non-skid or double-sided tape. °· Eliminate uneven floor surfaces. °· Select a carpet pattern that does not visually hide the edge of steps. °· Be aware of all pets. °OTHER HOME SAFETY TIPS °· Set the water temperature for 120° F (48.8° C). °· Keep emergency numbers on or near the telephone. °· Keep smoke detectors on every level of the home and near sleeping areas. °Document Released: 10/26/2002 Document Revised: 05/06/2012 Document Reviewed: 01/25/2012 °ExitCare® Patient Information ©2015  [employer]. This information is not intended to replace advice given to you by your health care provider. Make sure you discuss any questions you have with your health care provider. ° ° °Head Injury °You have received a head injury. It does not appear serious at this time. Headaches and vomiting are common following head injury. It should be easy to awaken from sleeping. Sometimes it is necessary for you to stay in the emergency department for a while for observation. Sometimes admission to the hospital may be needed. After injuries such as yours, most problems occur within the first 24 hours, but side effects may occur up to 7-10 days after the injury. It is important for you to carefully monitor your condition and contact your health care provider or seek immediate medical care if there is a change in your condition. °WHAT ARE THE TYPES OF HEAD INJURIES? °Head injuries can be as minor as a bump. Some head injuries can be more severe. More severe head injuries include: °· A jarring injury to the brain (concussion). °· A bruise of the brain (contusion). This mean there is bleeding in the brain that can cause swelling. °· A cracked skull (skull fracture). °· Bleeding in the brain that collects, clots, and forms a bump (hematoma). °WHAT CAUSES A HEAD INJURY? °A serious head injury is most likely to happen to someone who is in a car wreck and is not wearing a seat belt. Other causes of major head injuries include bicycle or motorcycle accidents, sports injuries, and falls. °HOW ARE HEAD INJURIES DIAGNOSED? °A complete history of the event leading to the injury and your current symptoms will be helpful in diagnosing head injuries. Many times, pictures of the brain, such as CT or MRI are needed to see the extent of the injury. Often, an overnight hospital stay is necessary for observation.  °WHEN SHOULD I SEEK IMMEDIATE MEDICAL CARE?  °You should get help right away if: °· You have confusion or drowsiness. °· You  feel sick to your stomach (nauseous) or have continued, forceful vomiting. °· You have dizziness or unsteadiness that is getting worse. °· You have severe, continued headaches not relieved by medicine. Only take over-the-counter or prescription medicines for pain, fever, or discomfort as directed by your health care provider. °· You do not have normal function of the arms or legs or are unable to walk. °· You notice changes in the black spots in the center of the colored part of your eye (pupil). °· You have a clear or bloody fluid coming from your nose or ears. °· You have a loss of vision. °During the next 24 hours after the injury, you must stay with someone who can watch you for the warning signs. This person should contact local emergency services (911 in the [place] if you have seizures, you become unconscious, or you are unable to wake up. °HOW CAN I PREVENT A HEAD INJURY IN THE FUTURE? °The most important factor for preventing major head injuries is avoiding motor   vehicle accidents.  To minimize the potential for damage to your head, it is crucial to wear seat belts while riding in motor vehicles. Wearing helmets while bike riding and playing collision sports (like football) is also helpful. Also, avoiding dangerous activities around the house will further help reduce your risk of head injury.  °WHEN CAN I RETURN TO NORMAL ACTIVITIES AND ATHLETICS? °You should be reevaluated by your health care provider before returning to these activities. If you have any of the following symptoms, you should not return to activities or contact sports until 1 week after the symptoms have stopped: °· Persistent headache. °· Dizziness or vertigo. °· Poor attention and concentration. °· Confusion. °· Memory problems. °· Nausea or vomiting. °· Fatigue or tire easily. °· Irritability. °· Intolerant of bright lights or loud noises. °· Anxiety or depression. °· Disturbed sleep. °MAKE SURE YOU:  °· Understand these  instructions. °· Will watch your condition. °· Will get help right away if you are not doing well or get worse. °Document Released: 11/05/2005 Document Revised: 11/10/2013 Document Reviewed: 07/13/2013 °ExitCare® Patient Information ©2015 [employer]. This information is not intended to replace advice given to you by your health care provider. Make sure you discuss any questions you have with your health care provider. ° °

## 2015-06-16 NOTE — ED Notes (Signed)
Bed: [facility] Expected date:  Expected time:  Means of arrival:  Comments: Elderly, fall

## 2015-06-16 NOTE — ED Notes (Signed)
Patient transported to X-ray 

## 2015-06-16 NOTE — ED Notes (Signed)
Per EMS pt from home with daughter; complaint of fall in restroom resulting in laceration to right eyebrow; NO LOC.

## 2015-06-20 LAB — URINE CULTURE: Culture: >=100000

## 2015-06-21 NOTE — Progress Notes (Addendum)
ED Antimicrobial Stewardship Positive Culture Follow Up   Dawn Saunders is [name] who presented to [facility] on 06/16/2015 with a chief complaint of  Chief Complaint  Patient presents with  . Fall  . Laceration    Recent Results (from the past 720 hour(s))  Urine culture     Status: None   Collection Time: 06/16/15 10:44 PM  Result Value Ref Range Status   Specimen Description URINE, RANDOM  Final   Special Requests NONE  Final   Culture   Final    >=100,000 COLONIES/mL ENTEROBACTER SPECIES Performed at [facility]    Report Status 06/20/2015 FINAL  Final   Organism ID, Bacteria ENTEROBACTER SPECIES  Final      Susceptibility   Enterobacter species - MIC*    CEFAZOLIN >=64 RESISTANT Resistant     CEFTRIAXONE <=1 SENSITIVE Sensitive     CIPROFLOXACIN <=0.25 SENSITIVE Sensitive     GENTAMICIN <=1 SENSITIVE Sensitive     IMIPENEM 1 SENSITIVE Sensitive     NITROFURANTOIN <=16 SENSITIVE Sensitive     TRIMETH/SULFA <=20 SENSITIVE Sensitive     PIP/TAZO <=4 SENSITIVE Sensitive     * >=100,000 COLONIES/mL ENTEROBACTER SPECIES   Pt reported no urinary symptoms, antibiotics not prescribed at initial ED encounter. Antibiotics not indicated.   ED Provider: Eyvonne Mechanic, PA-C   [name] 06/21/2015, 8:36 AM Infectious Diseases Pharmacist Phone# [phone]

## 2015-06-22 ENCOUNTER — Telehealth: Payer: Self-pay | Admitting: *Deleted

## 2015-06-22 NOTE — ED Notes (Signed)
(+)  urine culture, no treatment necessary, Burna Forts, PA-C

## 2015-08-18 DIAGNOSIS — K5900 Constipation, unspecified: Secondary | ICD-10-CM | POA: Diagnosis not present

## 2015-08-18 DIAGNOSIS — Z23 Encounter for immunization: Secondary | ICD-10-CM | POA: Diagnosis not present

## 2015-08-18 DIAGNOSIS — N3281 Overactive bladder: Secondary | ICD-10-CM | POA: Diagnosis not present

## 2015-08-18 DIAGNOSIS — M150 Primary generalized (osteo)arthritis: Secondary | ICD-10-CM | POA: Diagnosis not present

## 2015-08-18 DIAGNOSIS — I10 Essential (primary) hypertension: Secondary | ICD-10-CM | POA: Diagnosis not present

## 2015-09-30 ENCOUNTER — Emergency Department (HOSPITAL_COMMUNITY)
Admission: EM | Admit: 2015-09-30 | Discharge: 2015-09-30 | Disposition: A | Discharge status: Home / Self Care | Payer: Medicare Other | Attending: Emergency Medicine | Admitting: Emergency Medicine

## 2015-09-30 ENCOUNTER — Emergency Department (HOSPITAL_COMMUNITY): Payer: Medicare Other

## 2015-09-30 DIAGNOSIS — H269 Unspecified cataract: Secondary | ICD-10-CM | POA: Insufficient documentation

## 2015-09-30 DIAGNOSIS — Y92009 Unspecified place in unspecified non-institutional (private) residence as the place of occurrence of the external cause: Secondary | ICD-10-CM | POA: Diagnosis not present

## 2015-09-30 DIAGNOSIS — Z87891 Personal history of nicotine dependence: Secondary | ICD-10-CM | POA: Insufficient documentation

## 2015-09-30 DIAGNOSIS — S8991XA Unspecified injury of right lower leg, initial encounter: Secondary | ICD-10-CM | POA: Diagnosis not present

## 2015-09-30 DIAGNOSIS — W01198A Fall on same level from slipping, tripping and stumbling with subsequent striking against other object, initial encounter: Secondary | ICD-10-CM | POA: Diagnosis not present

## 2015-09-30 DIAGNOSIS — I10 Essential (primary) hypertension: Secondary | ICD-10-CM | POA: Diagnosis not present

## 2015-09-30 DIAGNOSIS — Y9389 Activity, other specified: Secondary | ICD-10-CM | POA: Diagnosis not present

## 2015-09-30 DIAGNOSIS — S81811A Laceration without foreign body, right lower leg, initial encounter: Secondary | ICD-10-CM

## 2015-09-30 DIAGNOSIS — Z862 Personal history of diseases of the blood and blood-forming organs and certain disorders involving the immune mechanism: Secondary | ICD-10-CM | POA: Insufficient documentation

## 2015-09-30 DIAGNOSIS — Z79899 Other long term (current) drug therapy: Secondary | ICD-10-CM | POA: Diagnosis not present

## 2015-09-30 DIAGNOSIS — W19XXXA Unspecified fall, initial encounter: Secondary | ICD-10-CM

## 2015-09-30 DIAGNOSIS — M1990 Unspecified osteoarthritis, unspecified site: Secondary | ICD-10-CM | POA: Diagnosis not present

## 2015-09-30 DIAGNOSIS — Y998 Other external cause status: Secondary | ICD-10-CM | POA: Diagnosis not present

## 2015-09-30 DIAGNOSIS — E785 Hyperlipidemia, unspecified: Secondary | ICD-10-CM | POA: Diagnosis not present

## 2015-09-30 DIAGNOSIS — S79911A Unspecified injury of right hip, initial encounter: Secondary | ICD-10-CM | POA: Insufficient documentation

## 2015-09-30 DIAGNOSIS — M25551 Pain in right hip: Secondary | ICD-10-CM | POA: Diagnosis not present

## 2015-09-30 DIAGNOSIS — S0990XA Unspecified injury of head, initial encounter: Secondary | ICD-10-CM | POA: Diagnosis not present

## 2015-09-30 DIAGNOSIS — T148 Other injury of unspecified body region: Secondary | ICD-10-CM | POA: Diagnosis not present

## 2015-09-30 MED ORDER — LIDOCAINE HCL (PF) 1 % IJ SOLN: 5.0000 mL | Freq: Once | INTRAMUSCULAR | Status: DC

## 2015-09-30 MED ORDER — LIDOCAINE-EPINEPHRINE 2 %-1:100000 IJ SOLN
20.0000 mL | Freq: Once | INTRAMUSCULAR | Status: DC
  Filled 2015-09-30: qty 1

## 2015-09-30 NOTE — ED Provider Notes (Signed)
CSN: [id number]     Arrival date & time 09/30/15  1627 History   First MD Initiated Contact with Patient 09/30/15 1647     Chief Complaint  Patient presents with  . Fall     (Consider location/radiation/quality/duration/timing/severity/associated sxs/prior Treatment) HPI  Patient is a 81 year old female with history of HTN and HLD who presents with fall and right leg laceration. Patient reports that she was washing her hands around 1 hour prior to arrival when she suddenly lost balance and fell backwards. Patient reports that she remembers falling backwards and hitting the floor. Patient denies any loss of consciousness. Patient denies hitting her head. Patient denies any preceding nausea, vomiting, or flushing. No chest pain, shortness of breath, or palpitations. Patient does not remember injuring her leg. Reports that she landed on top of her walker and then crawled to a phone to call EMS. Last tetanus shot 5 years ago.   Past Medical History  Diagnosis Date  . Hypertension   . Hyperlipidemia   . Loss of appetite   . Dizziness 09/25/2010  . Headache(784.0) 09/25/2010  . Clotting disorder 09/25/2010  . Diarrhea 09/25/2010  . Arthritis   . Cataracts, bilateral   . AAA (abdominal aortic aneurysm)    Past Surgical History  Procedure Laterality Date  . Arteriovenous graft placement    . Pr vein bypass graft,aorto-fem-pop     No family history on file. Social History  Substance Use Topics  . Smoking status: Former Smoker -- 2.00 packs/day    Types: Cigarettes  . Smokeless tobacco: Former Neurosurgeon    Quit date: 11/19/1969  . Alcohol Use: No   OB History    No data available     Review of Systems  Constitutional: Negative for fever and chills.  HENT: Negative.   Eyes: Negative.   Respiratory: Negative.   Cardiovascular: Negative.   Gastrointestinal: Negative.   Endocrine: Negative.   Genitourinary: Negative.   Musculoskeletal: Negative.   Skin: Negative.     Allergic/Immunologic: Negative.   Neurological: Negative.   Hematological: Negative.   Psychiatric/Behavioral: Negative.       Allergies  Heparin; Codeine; and Iodine  Home Medications   Prior to Admission medications   Medication Sig Start Date End Date Taking? Authorizing Provider  acetaminophen (TYLENOL) 500 MG tablet Take 500 mg by mouth every 6 (six) hours as needed for headache.    Yes Historical Provider, MD  amLODipine (NORVASC) 5 MG tablet Take 2.5 mg by mouth daily.   Yes Historical Provider, MD  Cholecalciferol (VITAMIN D3) 1000 UNITS CAPS Take 1 tablet by mouth daily.     Yes Historical Provider, MD  docusate sodium (COLACE) 100 MG capsule Take 100 mg by mouth daily as needed for mild constipation.   Yes Historical Provider, MD  lisinopril-hydrochlorothiazide (PRINZIDE,ZESTORETIC) 10-12.5 MG per tablet Take 1 tablet by mouth daily. 06/02/15  Yes Historical Provider, MD  loperamide (IMODIUM A-D) 2 MG tablet Take 2 mg by mouth 3 (three) times daily as needed for diarrhea or loose stools.    Yes Historical Provider, MD  Multiple Vitamins-Minerals (CENTRUM SILVER PO) Take 1 tablet by mouth daily.     Yes Historical Provider, MD  Polyethyl Glycol-Propyl Glycol (SYSTANE) 0.4-0.3 % GEL Place 1 drop into both eyes daily as needed (dry eyes).    Yes Historical Provider, MD  polyethylene glycol (MIRALAX / GLYCOLAX) packet Take 8.5 g by mouth every other day.    Yes Historical Provider, MD  solifenacin (VESICARE)  10 MG tablet Take 5-10 mg by mouth daily as needed (urination).    Yes Historical Provider, MD   BP 137/67 mmHg  Pulse 91  Temp(Src) 98.7 F (37.1 C) (Oral)  Resp 20  SpO2 98% Physical Exam  Constitutional: She is oriented to person, place, and time. She appears well-developed and well-nourished. No distress.  HENT:  Head: Normocephalic and atraumatic.  Eyes: EOM are normal. Pupils are equal, round, and reactive to light.  Neck: Normal range of motion.   Cardiovascular: Normal rate, regular rhythm and intact distal pulses.   No murmur heard. Pulmonary/Chest: Effort normal and breath sounds normal. She has no wheezes.  Abdominal: Soft. Bowel sounds are normal. She exhibits no distension. There is no tenderness.  Musculoskeletal:       Right hip: She exhibits decreased range of motion. She exhibits no tenderness.  Right leg shortened and externally rotated.   Neurological: She is alert and oriented to person, place, and time.  Skin: Skin is warm and dry. Laceration noted.     Psychiatric: She has a normal mood and affect. Her behavior is normal.  Nursing note and vitals reviewed.   ED Course  .Marland KitchenLaceration Repair Date/Time: 09/30/2015 10:05 PM Performed by: Ardith Dark Authorized by: Derwood Kaplan Consent: Verbal consent obtained. Risks and benefits: risks, benefits and alternatives were discussed Consent given by: patient Patient understanding: patient states understanding of the procedure being performed Patient consent: the patient's understanding of the procedure matches consent given Patient identity confirmed: verbally with patient Body area: lower extremity Laceration length: 7 cm Foreign bodies: no foreign bodies Tendon involvement: none Nerve involvement: none Vascular damage: no Anesthesia: local infiltration Local anesthetic: lidocaine 2% with epinephrine Anesthetic total: 10 ml Patient sedated: no Preparation: Patient was prepped and draped in the usual sterile fashion. Irrigation solution: saline Irrigation method: syringe Amount of cleaning: extensive Debridement: none Degree of undermining: none Skin closure: Ethilon Number of sutures: 10 Technique: simple and horizontal mattress Approximation: close Approximation difficulty: simple Dressing: antibiotic ointment Patient tolerance: Patient tolerated the procedure well with no immediate complications   (including critical care time) Labs  Review Labs Reviewed - No data to display  Imaging Review Dg Tibia/fibula Right  09/30/2015  CLINICAL DATA:  Larey Seat with deep laceration to the lower extremity. Fell in the home in the bathroom. EXAM: RIGHT TIBIA AND FIBULA - 2 VIEW COMPARISON:  None. FINDINGS: Soft tissue deformity in the lateral aspect of the mid lower leg. No evidence of radiopaque foreign object. No evidence of underlying fracture. IMPRESSION: Soft tissue injury without evidence of radiopaque foreign object or underlying fracture. Electronically Signed   By: Paulina Fusi M.D.   On: 09/30/2015 18:10   Ct Head Wo Contrast  09/30/2015  CLINICAL DATA:  Fall.  Patient fell at home. EXAM: CT HEAD WITHOUT CONTRAST TECHNIQUE: Contiguous axial images were obtained from the base of the skull through the vertex without intravenous contrast. COMPARISON:  06/16/2015 FINDINGS: Ventricles are normal in size, for this patient's age, and normal in configuration. There are no parenchymal masses or mass effect. There is no evidence of a cortical infarct. Mild white matter hypoattenuation noted consistent chronic microvascular ischemic change. There is a stable 16 mm meningioma along the right anterior falx cerebri. No other extra-axial masses. No abnormal fluid collections. There is no intracranial hemorrhage. Left maxillary sinus is opacified, which is chronic and unchanged. Remaining sinuses are clear as are the mastoid air cells. No skull fracture. There is some mild  bony expansion of the left frontal bone, which is stable, without an underlying lesion. IMPRESSION: 1. No acute intracranial abnormalities. 2. Stable right frontal region meningioma. Electronically Signed   By: Amie Portlandavid  Ormond M.D.   On: 09/30/2015 18:46   Dg Hip Unilat With Pelvis 2-3 Views Right  09/30/2015  CLINICAL DATA:  Status post fall, with right hip and right leg pain. Initial encounter. EXAM: DG HIP (WITH OR WITHOUT PELVIS) 2-3V RIGHT COMPARISON:  CTA of the chest, abdomen and  pelvis performed 09/25/2010 FINDINGS: There is no definite evidence of acute fracture or dislocation. Marked degenerative change is noted at the right hip, with loss of the joint space, diffuse sclerosis and deformity of the right femoral head and neck. This is mildly worsened from 2011. Slight axial joint space narrowing is noted at the left hip. Mild degenerative change is noted at the lower lumbar spine. Scattered vascular calcifications are seen. The visualized bowel gas pattern is grossly unremarkable. IMPRESSION: 1. No definite evidence of acute fracture or dislocation. 2. Marked degenerative change at the right hip is mildly worsened from 2011, with diffuse sclerosis, loss of the joint space, and deformity of the right femoral head and neck. Electronically Signed   By: Roanna RaiderJeffery  Chang M.D.   On: 09/30/2015 18:14   I have personally reviewed and evaluated these images and lab results as part of my medical decision-making.   EKG Interpretation None      MDM   Final diagnoses:  Fall, initial encounter  Leg laceration, right, initial encounter   5:14 PM Patient is a 81 year old female with history of HTN and HLD who presents with a right leg laceration after falling at home while washing her hands. Fall seems to be mechanical in nature. Patient denies loss of consciousness or head trauma. Will check head CT, tib/fib, and pelvis plain films. Tetanus booster offered to patient, however she declined.   10:04 PM Head CT negative. No lower leg fracture. Pelvis plain film with severe degenerative changes, but not acute fractures. Laceration repaired. Patient tolerated well. Will ambulate patient.   10:55 PM Patient able to ambulate with walker. Will discharge home to follow up with PCP.  Ardith Darkaleb M Parker, MD 09/30/15 [phone]  Derwood KaplanAnkit Nanavati, MD 10/01/15 [phone]

## 2015-09-30 NOTE — ED Notes (Signed)
[facility] presented with a 81 yo female with a fall from home.  Pt was in bathroom when she fell and cut her leg.  Deep laceration reported by [facility].  Pt crawled to telephone to call for help.  No other obvious signs of injury.  No c/o leg pain, head pain, No LOC.  Pt alert and oriented X4.  Pt lives with daughter but daughter was out at grocery store at time of fall and [facility]

## 2015-09-30 NOTE — Discharge Instructions (Signed)
Laceration Care, Adult  A laceration is a cut that goes through all layers of the skin. The cut also goes into the tissue that is right under the skin. Some cuts heal on their own. Others need to be closed with stitches (sutures), staples, skin adhesive strips, or wound glue. Taking care of your cut lowers your risk of infection and helps your cut to heal better.  HOW TO TAKE CARE OF YOUR CUT  For stitches or staples:  · Keep the wound clean and dry.  · If you were given a bandage (dressing), you should change it at least one time per day or as told by your doctor. You should also change it if it gets wet or dirty.  · Keep the wound completely dry for the first 24 hours or as told by your doctor. After that time, you may take a shower or a bath. However, make sure that the wound is not soaked in water until after the stitches or staples have been removed.  · Clean the wound one time each day or as told by your doctor:    Wash the wound with soap and water.    Rinse the wound with water until all of the soap comes off.    Pat the wound dry with a clean towel. Do not rub the wound.  · After you clean the wound, put a thin layer of antibiotic ointment on it as told by your doctor. This ointment:    Helps to prevent infection.    Keeps the bandage from sticking to the wound.  · Have your stitches or staples removed as told by your doctor.  If your doctor used skin adhesive strips:   · Keep the wound clean and dry.  · If you were given a bandage, you should change it at least one time per day or as told by your doctor. You should also change it if it gets dirty or wet.  · Do not get the skin adhesive strips wet. You can take a shower or a bath, but be careful to keep the wound dry.  · If the wound gets wet, pat it dry with a clean towel. Do not rub the wound.  · Skin adhesive strips fall off on their own. You can trim the strips as the wound heals. Do not remove any strips that are still stuck to the wound. They will  fall off after a while.  If your doctor used wound glue:  · Try to keep your wound dry, but you may briefly wet it in the shower or bath. Do not soak the wound in water, such as by swimming.  · After you take a shower or a bath, gently pat the wound dry with a clean towel. Do not rub the wound.  · Do not do any activities that will make you really sweaty until the skin glue has fallen off on its own.  · Do not apply liquid, cream, or ointment medicine to your wound while the skin glue is still on.  · If you were given a bandage, you should change it at least one time per day or as told by your doctor. You should also change it if it gets dirty or wet.  · If a bandage is placed over the wound, do not let the tape for the bandage touch the skin glue.  · Do not pick at the glue. The skin glue usually stays on for 5-10 days. Then, it   or when wound glue stays in place and the wound is healed. Make sure to wear a sunscreen of at least 30 SPF.  Take over-the-counter and prescription medicines only as told by your doctor.  If you were given antibiotic medicine or ointment, take or apply it as told by your doctor. Do not stop using the antibiotic even if your wound is getting better.  Do not scratch or pick at the wound.  Keep all follow-up visits as told by your doctor. This is important.  Check your wound every day for signs of infection. Watch for:  Redness, swelling, or pain.  Fluid, blood, or pus.  Raise (elevate) the injured area above the level of your heart while you are sitting or lying down, if possible. GET HELP IF:  You got a tetanus shot and you have any of these problems at the injection site:  Swelling.  Very bad pain.  Redness.  Bleeding.  You have a fever.  A wound that was  closed breaks open.  You notice a bad smell coming from your wound or your bandage.  You notice something coming out of the wound, such as wood or glass.  Medicine does not help your pain.  You have more redness, swelling, or pain at the site of your wound.  You have fluid, blood, or pus coming from your wound.  You notice a change in the color of your skin near your wound.  You need to change the bandage often because fluid, blood, or pus is coming from the wound.  You start to have a new rash.  You start to have numbness around the wound. GET HELP RIGHT AWAY IF:  You have very bad swelling around the wound.  Your pain suddenly gets worse and is very bad.  You notice painful lumps near the wound or on skin that is anywhere on your body.  You have a red streak going away from your wound.  The wound is on your hand or foot and you cannot move a finger or toe like you usually can.  The wound is on your hand or foot and you notice that your fingers or toes look pale or bluish.   This information is not intended to replace advice given to you by your health care provider. Make sure you discuss any questions you have with your health care provider.   Document Released: 04/23/2008 Document Revised: 03/22/2015 Document Reviewed: 11/01/2014 Elsevier Interactive Patient Education 2016 [employer]. Fall Prevention in the Home  Falls can cause injuries. They can happen to people of all ages. There are many things you can do to make your home safe and to help prevent falls.  WHAT CAN I DO ON THE OUTSIDE OF MY HOME?  Regularly fix the edges of walkways and driveways and fix any cracks.  Remove anything that might make you trip as you walk through a door, such as a raised step or threshold.  Trim any bushes or trees on the path to your home.  Use bright outdoor lighting.  Clear any walking paths of anything that might make someone trip, such as rocks or tools.  Regularly check  to see if handrails are loose or broken. Make sure that both sides of any steps have handrails.  Any raised decks and porches should have guardrails on the edges.  Have any leaves, snow, or ice cleared regularly.  Use sand or salt on walking paths during winter.  Clean up any spills in your  garage right away. This includes oil or grease spills. WHAT CAN I DO IN THE BATHROOM?   Use night lights.  Install grab bars by the toilet and in the tub and shower. Do not use towel bars as grab bars.  Use non-skid mats or decals in the tub or shower.  If you need to sit down in the shower, use a plastic, non-slip stool.  Keep the floor dry. Clean up any water that spills on the floor as soon as it happens.  Remove soap buildup in the tub or shower regularly.  Attach bath mats securely with double-sided non-slip rug tape.  Do not have throw rugs and other things on the floor that can make you trip. WHAT CAN I DO IN THE BEDROOM?  Use night lights.  Make sure that you have a light by your bed that is easy to reach.  Do not use any sheets or blankets that are too big for your bed. They should not hang down onto the floor.  Have a firm chair that has side arms. You can use this for support while you get dressed.  Do not have throw rugs and other things on the floor that can make you trip. WHAT CAN I DO IN THE KITCHEN?  Clean up any spills right away.  Avoid walking on wet floors.  Keep items that you use a lot in easy-to-reach places.  If you need to reach something above you, use a strong step stool that has a grab bar.  Keep electrical cords out of the way.  Do not use floor polish or wax that makes floors slippery. If you must use wax, use non-skid floor wax.  Do not have throw rugs and other things on the floor that can make you trip. WHAT CAN I DO WITH MY STAIRS?  Do not leave any items on the stairs.  Make sure that there are handrails on both sides of the stairs and use  them. Fix handrails that are broken or loose. Make sure that handrails are as long as the stairways.  Check any carpeting to make sure that it is firmly attached to the stairs. Fix any carpet that is loose or worn.  Avoid having throw rugs at the top or bottom of the stairs. If you do have throw rugs, attach them to the floor with carpet tape.  Make sure that you have a light switch at the top of the stairs and the bottom of the stairs. If you do not have them, ask someone to add them for you. WHAT ELSE CAN I DO TO HELP PREVENT FALLS?  Wear shoes that:  Do not have high heels.  Have rubber bottoms.  Are comfortable and fit you well.  Are closed at the toe. Do not wear sandals.  If you use a stepladder:  Make sure that it is fully opened. Do not climb a closed stepladder.  Make sure that both sides of the stepladder are locked into place.  Ask someone to hold it for you, if possible.  Clearly mark and make sure that you can see:  Any grab bars or handrails.  First and last steps.  Where the edge of each step is.  Use tools that help you move around (mobility aids) if they are needed. These include:  Canes.  Walkers.  Scooters.  Crutches.  Turn on the lights when you go into a dark area. Replace any light bulbs as soon as they burn out.  Set  up your furniture so you have a clear path. Avoid moving your furniture around.  If any of your floors are uneven, fix them.  If there are any pets around you, be aware of where they are.  Review your medicines with your doctor. Some medicines can make you feel dizzy. This can increase your chance of falling. Ask your doctor what other things that you can do to help prevent falls.   This information is not intended to replace advice given to you by your health care provider. Make sure you discuss any questions you have with your health care provider.   Document Released: 09/01/2009 Document Revised: 03/22/2015 Document  Reviewed: 12/10/2014 Elsevier Interactive Patient Education [employer] Elsevier Inc.

## 2015-10-10 DIAGNOSIS — S81819A Laceration without foreign body, unspecified lower leg, initial encounter: Secondary | ICD-10-CM | POA: Diagnosis not present

## 2015-10-10 DIAGNOSIS — T148 Other injury of unspecified body region: Secondary | ICD-10-CM | POA: Diagnosis not present

## 2016-03-21 DIAGNOSIS — K5900 Constipation, unspecified: Secondary | ICD-10-CM | POA: Diagnosis not present

## 2016-03-21 DIAGNOSIS — M150 Primary generalized (osteo)arthritis: Secondary | ICD-10-CM | POA: Diagnosis not present

## 2016-03-21 DIAGNOSIS — N3281 Overactive bladder: Secondary | ICD-10-CM | POA: Diagnosis not present

## 2016-03-21 DIAGNOSIS — I10 Essential (primary) hypertension: Secondary | ICD-10-CM | POA: Diagnosis not present

## 2016-08-07 DIAGNOSIS — R350 Frequency of micturition: Secondary | ICD-10-CM | POA: Diagnosis not present

## 2016-08-08 ENCOUNTER — Encounter (HOSPITAL_COMMUNITY): Payer: Self-pay | Admitting: Emergency Medicine

## 2016-08-08 ENCOUNTER — Inpatient Hospital Stay (HOSPITAL_COMMUNITY)
Admission: EM | Admit: 2016-08-08 | Discharge: 2016-08-10 | DRG: 690 | Disposition: A | Discharge status: Home Health | Payer: Medicare Other | Attending: Internal Medicine | Admitting: Internal Medicine

## 2016-08-08 DIAGNOSIS — Z885 Allergy status to narcotic agent status: Secondary | ICD-10-CM

## 2016-08-08 DIAGNOSIS — Z9889 Other specified postprocedural states: Secondary | ICD-10-CM

## 2016-08-08 DIAGNOSIS — R262 Difficulty in walking, not elsewhere classified: Secondary | ICD-10-CM

## 2016-08-08 DIAGNOSIS — A419 Sepsis, unspecified organism: Secondary | ICD-10-CM | POA: Diagnosis present

## 2016-08-08 DIAGNOSIS — Z8679 Personal history of other diseases of the circulatory system: Secondary | ICD-10-CM

## 2016-08-08 DIAGNOSIS — L8992 Pressure ulcer of unspecified site, stage 2: Secondary | ICD-10-CM | POA: Diagnosis present

## 2016-08-08 DIAGNOSIS — L89312 Pressure ulcer of right buttock, stage 2: Secondary | ICD-10-CM | POA: Diagnosis present

## 2016-08-08 DIAGNOSIS — L8990 Pressure ulcer of unspecified site, unspecified stage: Secondary | ICD-10-CM | POA: Insufficient documentation

## 2016-08-08 DIAGNOSIS — R531 Weakness: Secondary | ICD-10-CM | POA: Diagnosis not present

## 2016-08-08 DIAGNOSIS — E785 Hyperlipidemia, unspecified: Secondary | ICD-10-CM | POA: Diagnosis present

## 2016-08-08 DIAGNOSIS — I10 Essential (primary) hypertension: Secondary | ICD-10-CM | POA: Diagnosis present

## 2016-08-08 DIAGNOSIS — Z95828 Presence of other vascular implants and grafts: Secondary | ICD-10-CM

## 2016-08-08 DIAGNOSIS — D689 Coagulation defect, unspecified: Secondary | ICD-10-CM | POA: Diagnosis present

## 2016-08-08 DIAGNOSIS — I959 Hypotension, unspecified: Secondary | ICD-10-CM | POA: Diagnosis not present

## 2016-08-08 DIAGNOSIS — E440 Moderate protein-calorie malnutrition: Secondary | ICD-10-CM | POA: Diagnosis present

## 2016-08-08 DIAGNOSIS — N390 Urinary tract infection, site not specified: Secondary | ICD-10-CM | POA: Diagnosis not present

## 2016-08-08 DIAGNOSIS — Z888 Allergy status to other drugs, medicaments and biological substances status: Secondary | ICD-10-CM

## 2016-08-08 DIAGNOSIS — Z79899 Other long term (current) drug therapy: Secondary | ICD-10-CM

## 2016-08-08 DIAGNOSIS — Z66 Do not resuscitate: Secondary | ICD-10-CM | POA: Diagnosis present

## 2016-08-08 DIAGNOSIS — E860 Dehydration: Secondary | ICD-10-CM | POA: Diagnosis not present

## 2016-08-08 DIAGNOSIS — Z681 Body mass index (BMI) 19 or less, adult: Secondary | ICD-10-CM | POA: Diagnosis not present

## 2016-08-08 DIAGNOSIS — Z87891 Personal history of nicotine dependence: Secondary | ICD-10-CM

## 2016-08-08 LAB — BASIC METABOLIC PANEL
ANION GAP: 8 (ref 5–15)
BUN: 19 mg/dL (ref 6–20)
CALCIUM: 9.4 mg/dL (ref 8.9–10.3)
CHLORIDE: 96 mmol/L — AB (ref 101–111)
CO2: 30 mmol/L (ref 22–32)
Creatinine, Ser: 0.68 mg/dL (ref 0.44–1.00)
GFR calc non Af Amer: >60 mL/min (ref >60)
Glucose, Bld: 114 mg/dL — ABNORMAL HIGH (ref 65–99)
Potassium: 3.8 mmol/L (ref 3.5–5.1)
Sodium: 134 mmol/L — ABNORMAL LOW (ref 135–145)

## 2016-08-08 LAB — CBC
HCT: 38.6 % (ref 36.0–46.0)
HEMOGLOBIN: 13.4 g/dL (ref 12.0–15.0)
MCH: 28.6 pg (ref 26.0–34.0)
MCHC: 34.7 g/dL (ref 30.0–36.0)
MCV: 82.5 fL (ref 78.0–100.0)
PLATELETS: 164 10*3/uL (ref 150–400)
RBC: 4.68 MIL/uL (ref 3.87–5.11)
RDW: 16.4 % — ABNORMAL HIGH (ref 11.5–15.5)
WBC: 12.8 10*3/uL — ABNORMAL HIGH (ref 4.0–10.5)

## 2016-08-08 LAB — CBG MONITORING, ED: Glucose-Capillary: 107 mg/dL — ABNORMAL HIGH (ref 65–99)

## 2016-08-08 NOTE — ED Triage Notes (Signed)
Pt BIB EMS from home with c/o weakness; diagnosed with UTI at PCP yesterday; prescribed Septra, but skipped tonight's dose because of the weakness; daughter states pt can usually sit up and eat independently; pt drowsy and states she is usually asleep by this time; A&Ox4.

## 2016-08-08 NOTE — ED Notes (Signed)
Bed: [id number] Expected date:  Expected time:  Means of arrival:  Comments: EMS 82 yo female generalized weakness/UTI had 1 dose of antibiotics

## 2016-08-08 NOTE — ED Provider Notes (Signed)
[facility]-EMERGENCY DEPT Provider Note   CSN: [id number] Arrival date & time: 08/08/16  2231  By signing my name below, I, Vista Mink, attest that this documentation has been prepared under the direction and in the presence of [employer].  Electronically Signed: Vista Mink, ED Scribe. 08/08/16. 11:12 PM.   History   Chief Complaint Chief Complaint  Patient presents with  . Weakness    HPI HPI Comments: Dawn Saunders is a 82 y.o. female with a Hx of AAA, HTN, who presents to the Emergency Department for concerns of weakness that was noticed tonight. Pt's daughter states that the pt seemed unsteady when ambulating this evening. Pt normally walks with with a walker and was "wobbly" tonight when using it. Daughter also states that the pt seemed like she was about to fall of her wheelchair tonight. She was recently diagnosed with a UTI by her PCP. Had her first dose of abx this morning. Pt reports that she felt a little off balance this morning. Her daughter states that this sometimes happens when the pt's blood pressure becomes low. Her BP normally runs 120/70 but pt states that she believes it has been running low recently. Pt's BP was 66/35 PTA which prompted family to take the pt to the ED. Pt's BP is currently 123/63. Her last bowel movement was yesterday. Pt denies fever, vomiting, diarrhea, chest pain, shortness of breath, abdominal pain, nausea, headache.   HPI  Past Medical History:  Diagnosis Date  . AAA (abdominal aortic aneurysm) (HCC)   . Arthritis   . Cataracts, bilateral   . Clotting disorder (HCC) 09/25/2010  . Diarrhea 09/25/2010  . Dizziness 09/25/2010  . Headache(784.0) 09/25/2010  . Hyperlipidemia   . Hypertension   . Loss of appetite     Patient Active Problem List   Diagnosis Date Noted  . UTI (lower urinary tract infection) 08/09/2016  . Hypertension   . 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 ARTERIOVENOUS GRAFT PLACEMENT    . PR VEIN BYPASS GRAFT,AORTO-FEM-POP      OB History    No data available       Home Medications    Prior to Admission medications   Medication Sig Start Date End Date Taking? Authorizing Provider  acetaminophen (TYLENOL) 500 MG tablet Take 500 mg by mouth every 6 (six) hours as needed for headache.    Yes Historical Provider, MD  amLODipine (NORVASC) 5 MG tablet Take 2.5 mg by mouth daily.   Yes Historical Provider, MD  docusate sodium (COLACE) 100 MG capsule Take 100 mg by mouth daily as needed for mild constipation.   Yes Historical Provider, MD  lisinopril-hydrochlorothiazide (PRINZIDE,ZESTORETIC) 10-12.5 MG per tablet Take 1 tablet by mouth daily. 06/02/15  Yes Historical Provider, MD  loperamide (IMODIUM A-D) 2 MG tablet Take 2 mg by mouth 3 (three) times daily as needed for diarrhea or loose stools.    Yes Historical Provider, MD  Multiple Vitamins-Minerals (CENTRUM SILVER PO) Take 1 tablet by mouth daily.     Yes Historical Provider, MD  Polyethyl Glycol-Propyl Glycol (SYSTANE) 0.4-0.3 % GEL Place 1 drop into both eyes daily as needed (dry eyes).    Yes Historical Provider, MD  polyethylene glycol (MIRALAX / GLYCOLAX) packet Take 8.5 g by mouth every other day.    Yes  Historical Provider, MD  solifenacin (VESICARE) 10 MG tablet Take 5-10 mg by mouth daily as needed (urination).    Yes Historical Provider, MD  sulfamethoxazole-trimethoprim (BACTRIM DS,SEPTRA DS) 800-160 MG tablet Take 1 tablet by mouth 2 (two) times daily. 08/07/16  Yes Historical Provider, MD    Family History History reviewed. No pertinent family history.  Social History Social History  Substance Use Topics  . Smoking status: Former Smoker    Packs/day: 2.00    Types: Cigarettes  . Smokeless tobacco: Former NeurosurgeonUser    Quit date:  11/19/1969  . Alcohol use No     Allergies   Heparin; Codeine; Iodine; Pravachol [pravastatin sodium]; and Zocor [simvastatin]   Review of Systems Review of Systems A complete 10 system review of systems was obtained and all systems are negative except as noted in the HPI and PMH.    Physical Exam Updated Vital Signs BP 108/65 (BP Location: Right Arm)   Pulse 91   Temp 98.1 F (36.7 C) (Oral)   Resp 16   SpO2 96%   Physical Exam  Constitutional: She is oriented to person, place, and time. She appears well-developed and well-nourished. No distress.  Nontoxic appearing and in NAD  HENT:  Head: Normocephalic and atraumatic.  Eyes: Conjunctivae and EOM are normal. No scleral icterus.  Neck: Normal range of motion.  Cardiovascular: Normal rate, regular rhythm and intact distal pulses.   Pulmonary/Chest: Effort normal. No respiratory distress. She has no wheezes. She has no rales.  Respirations even and unlabored  Abdominal: Soft. She exhibits no mass. There is no tenderness. There is no guarding.  Soft, nontender abdomen  Musculoskeletal: Normal range of motion.  Neurological: She is alert and oriented to person, place, and time. She exhibits normal muscle tone. Coordination normal.  GCS 15. Patient moving all extremities. Speech is goal oriented. Patient answering questions appropriately and following commands.  Skin: Skin is warm and dry. No rash noted. She is not diaphoretic. No erythema. No pallor.  Psychiatric: She has a normal mood and affect. Her behavior is normal.  Nursing note and vitals reviewed.    ED Treatments / Results  DIAGNOSTIC STUDIES: Oxygen Saturation is 96% on RA, normal by my interpretation.  COORDINATION OF CARE: 11:12 PM-Will order blood work. Discussed treatment plan with pt at bedside and pt agreed to plan.   Labs (all labs ordered are listed, but only abnormal results are displayed) Labs Reviewed  BASIC METABOLIC PANEL - Abnormal; Notable for  the following:       Result Value   Sodium 134 (*)    Chloride 96 (*)    Glucose, Bld 114 (*)    All other components within normal limits  CBC - Abnormal; Notable for the following:    WBC 12.8 (*)    RDW 16.4 (*)    All other components within normal limits  URINALYSIS, ROUTINE W REFLEX MICROSCOPIC (NOT AT [facility]) - Abnormal; Notable for the following:    Color, Urine ORANGE (*)    APPearance CLOUDY (*)    Bilirubin Urine SMALL (*)    Protein, ur 30 (*)    Nitrite POSITIVE (*)    Leukocytes, UA LARGE (*)    All other components within normal limits  URINE MICROSCOPIC-ADD ON - Abnormal; Notable for the following:    Squamous Epithelial / LPF 0-5 (*)    Bacteria, UA RARE (*)    Casts HYALINE CASTS (*)    Crystals CA OXALATE CRYSTALS (*)  All other components within normal limits  CBG MONITORING, ED - Abnormal; Notable for the following:    Glucose-Capillary 107 (*)    All other components within normal limits  URINE CULTURE    EKG  EKG Interpretation None       Radiology No results found.  Procedures Procedures (including critical care time)  Medications Ordered in ED Medications  cefTRIAXone (ROCEPHIN) 1 g in dextrose 5 % 50 mL IVPB (1 g Intravenous New Bag/Given 08/09/16 0113)  sodium chloride 0.9 % bolus 1,000 mL (not administered)     Initial Impression / Assessment and Plan / ED Course  I have reviewed the triage vital signs and the nursing notes.  Pertinent labs & imaging results that were available during my care of the patient were reviewed by me and considered in my medical decision making (see chart for details).  Clinical Course    82 year old female patient is to the emergency department for an episode of weakness. Family reporting hypotension prior to arrival. Patient has had no hypotension or tachycardia in the emergency department. She is afebrile. Patient was recently diagnosed with a urinary tract infection. Her urinalysis today is consistent  with this diagnosis. Given age and recorded hypotension in the setting of UTI, I believe the patient warrants further observation. Case discussed with Dr. Clyde Lundborg of [facility] who will admit.   Final Clinical Impressions(s) / ED Diagnoses   Final diagnoses:  UTI (lower urinary tract infection)    New Prescriptions New Prescriptions   No medications on file    I personally performed the services described in this documentation, which was scribed in my presence. The recorded information has been reviewed and is accurate.       Antony Madura, PA-C 08/09/16 0132    Arby Barrette, MD 08/09/16 [phone]

## 2016-08-09 ENCOUNTER — Encounter (HOSPITAL_COMMUNITY): Payer: Self-pay | Admitting: Internal Medicine

## 2016-08-09 DIAGNOSIS — N390 Urinary tract infection, site not specified: Secondary | ICD-10-CM | POA: Diagnosis not present

## 2016-08-09 DIAGNOSIS — Z888 Allergy status to other drugs, medicaments and biological substances status: Secondary | ICD-10-CM | POA: Diagnosis not present

## 2016-08-09 DIAGNOSIS — Z87891 Personal history of nicotine dependence: Secondary | ICD-10-CM | POA: Diagnosis not present

## 2016-08-09 DIAGNOSIS — I10 Essential (primary) hypertension: Secondary | ICD-10-CM | POA: Diagnosis present

## 2016-08-09 DIAGNOSIS — A419 Sepsis, unspecified organism: Secondary | ICD-10-CM | POA: Diagnosis not present

## 2016-08-09 DIAGNOSIS — Z95828 Presence of other vascular implants and grafts: Secondary | ICD-10-CM | POA: Diagnosis not present

## 2016-08-09 DIAGNOSIS — L8992 Pressure ulcer of unspecified site, stage 2: Secondary | ICD-10-CM | POA: Diagnosis present

## 2016-08-09 DIAGNOSIS — L8990 Pressure ulcer of unspecified site, unspecified stage: Secondary | ICD-10-CM | POA: Insufficient documentation

## 2016-08-09 DIAGNOSIS — D689 Coagulation defect, unspecified: Secondary | ICD-10-CM | POA: Diagnosis present

## 2016-08-09 DIAGNOSIS — Z66 Do not resuscitate: Secondary | ICD-10-CM | POA: Diagnosis present

## 2016-08-09 DIAGNOSIS — L89312 Pressure ulcer of right buttock, stage 2: Secondary | ICD-10-CM | POA: Diagnosis present

## 2016-08-09 DIAGNOSIS — Z79899 Other long term (current) drug therapy: Secondary | ICD-10-CM | POA: Diagnosis not present

## 2016-08-09 DIAGNOSIS — Z681 Body mass index (BMI) 19 or less, adult: Secondary | ICD-10-CM | POA: Diagnosis not present

## 2016-08-09 DIAGNOSIS — Z8679 Personal history of other diseases of the circulatory system: Secondary | ICD-10-CM | POA: Diagnosis not present

## 2016-08-09 DIAGNOSIS — E785 Hyperlipidemia, unspecified: Secondary | ICD-10-CM | POA: Diagnosis present

## 2016-08-09 DIAGNOSIS — E440 Moderate protein-calorie malnutrition: Secondary | ICD-10-CM | POA: Diagnosis present

## 2016-08-09 DIAGNOSIS — Z885 Allergy status to narcotic agent status: Secondary | ICD-10-CM | POA: Diagnosis not present

## 2016-08-09 LAB — BASIC METABOLIC PANEL
ANION GAP: 8 (ref 5–15)
BUN: 19 mg/dL (ref 6–20)
CALCIUM: 8.6 mg/dL — AB (ref 8.9–10.3)
CO2: 27 mmol/L (ref 22–32)
Chloride: 99 mmol/L — ABNORMAL LOW (ref 101–111)
Creatinine, Ser: 0.54 mg/dL (ref 0.44–1.00)
GLUCOSE: 104 mg/dL — AB (ref 65–99)
POTASSIUM: 3.7 mmol/L (ref 3.5–5.1)
Sodium: 134 mmol/L — ABNORMAL LOW (ref 135–145)

## 2016-08-09 LAB — HEPATIC FUNCTION PANEL
ALT: 15 U/L (ref 14–54)
AST: 17 U/L (ref 15–41)
Albumin: 2.9 g/dL — ABNORMAL LOW (ref 3.5–5.0)
Alkaline Phosphatase: 55 U/L (ref 38–126)
BILIRUBIN DIRECT: 0.3 mg/dL (ref 0.1–0.5)
BILIRUBIN TOTAL: 1.6 mg/dL — AB (ref 0.3–1.2)
Indirect Bilirubin: 1.3 mg/dL — ABNORMAL HIGH (ref 0.3–0.9)
Total Protein: 5.3 g/dL — ABNORMAL LOW (ref 6.5–8.1)

## 2016-08-09 LAB — URINALYSIS, ROUTINE W REFLEX MICROSCOPIC
Glucose, UA: NEGATIVE mg/dL
HGB URINE DIPSTICK: NEGATIVE
Ketones, ur: NEGATIVE mg/dL
Nitrite: POSITIVE — AB
Protein, ur: 30 mg/dL — AB
SPECIFIC GRAVITY, URINE: 1.017 (ref 1.005–1.030)
pH: 6 (ref 5.0–8.0)

## 2016-08-09 LAB — CBC
HEMATOCRIT: 36.7 % (ref 36.0–46.0)
HEMOGLOBIN: 12.3 g/dL (ref 12.0–15.0)
MCH: 28.5 pg (ref 26.0–34.0)
MCHC: 33.5 g/dL (ref 30.0–36.0)
MCV: 85 fL (ref 78.0–100.0)
Platelets: 156 10*3/uL (ref 150–400)
RBC: 4.32 MIL/uL (ref 3.87–5.11)
RDW: 16.7 % — ABNORMAL HIGH (ref 11.5–15.5)
WBC: 8.7 10*3/uL (ref 4.0–10.5)

## 2016-08-09 LAB — URINE MICROSCOPIC-ADD ON: RBC / HPF: NONE SEEN RBC/hpf (ref 0–5)

## 2016-08-09 LAB — LACTIC ACID, PLASMA
LACTIC ACID, VENOUS: 1 mmol/L (ref 0.5–1.9)
LACTIC ACID, VENOUS: 1.2 mmol/L (ref 0.5–1.9)

## 2016-08-09 LAB — PROCALCITONIN: Procalcitonin: 0.61 ng/mL

## 2016-08-09 LAB — PROTIME-INR
INR: 1.21
PROTHROMBIN TIME: 15.4 s — AB (ref 11.4–15.2)

## 2016-08-09 LAB — APTT: APTT: 26 s (ref 24–36)

## 2016-08-09 MED ORDER — ENSURE ENLIVE PO LIQD
237.0000 mL | Freq: Two times a day (BID) | ORAL | Status: DC
  Administered 2016-08-09 – 2016-08-10 (×2): 237 mL via ORAL

## 2016-08-09 MED ORDER — HYDRALAZINE HCL 20 MG/ML IJ SOLN: 5.0000 mg | INTRAMUSCULAR | Status: DC | PRN

## 2016-08-09 MED ORDER — DEXTROSE 5 % IV SOLN
1.0000 g | Freq: Once | INTRAVENOUS | Status: AC
  Administered 2016-08-09: 1 g via INTRAVENOUS
  Filled 2016-08-09: qty 10

## 2016-08-09 MED ORDER — MEGESTROL ACETATE 400 MG/10ML PO SUSP
400.0000 mg | Freq: Every day | ORAL | Status: DC
  Administered 2016-08-09 – 2016-08-10 (×2): 400 mg via ORAL
  Filled 2016-08-09 (×2): qty 10

## 2016-08-09 MED ORDER — ADULT MULTIVITAMIN W/MINERALS CH
1.0000 | ORAL_TABLET | Freq: Every day | ORAL | Status: DC
  Administered 2016-08-09 – 2016-08-10 (×2): 1 via ORAL
  Filled 2016-08-09 (×2): qty 1

## 2016-08-09 MED ORDER — POLYETHYLENE GLYCOL 3350 17 G PO PACK
8.5000 g | PACK | ORAL | Status: DC
  Administered 2016-08-09: 8.5 g via ORAL
  Filled 2016-08-09: qty 1

## 2016-08-09 MED ORDER — ACETAMINOPHEN 500 MG PO TABS: 500.0000 mg | ORAL_TABLET | Freq: Four times a day (QID) | ORAL | Status: DC | PRN

## 2016-08-09 MED ORDER — POLYVINYL ALCOHOL 1.4 % OP SOLN: Freq: Every day | OPHTHALMIC | Status: DC | PRN

## 2016-08-09 MED ORDER — SODIUM CHLORIDE 0.9 % IV SOLN
INTRAVENOUS | Status: DC
  Administered 2016-08-09 – 2016-08-10 (×2): via INTRAVENOUS

## 2016-08-09 MED ORDER — LOPERAMIDE HCL 2 MG PO CAPS: 2.0000 mg | ORAL_CAPSULE | Freq: Three times a day (TID) | ORAL | Status: DC | PRN

## 2016-08-09 MED ORDER — ORAL CARE MOUTH RINSE
15.0000 mL | Freq: Two times a day (BID) | OROMUCOSAL | Status: DC
  Administered 2016-08-09 – 2016-08-10 (×3): 15 mL via OROMUCOSAL

## 2016-08-09 MED ORDER — ONDANSETRON HCL 4 MG PO TABS: 4.0000 mg | ORAL_TABLET | Freq: Four times a day (QID) | ORAL | Status: DC | PRN

## 2016-08-09 MED ORDER — SODIUM CHLORIDE 0.9 % IV BOLUS (SEPSIS)
500.0000 mL | Freq: Once | INTRAVENOUS | Status: AC
  Administered 2016-08-09: 500 mL via INTRAVENOUS

## 2016-08-09 MED ORDER — DARIFENACIN HYDROBROMIDE ER 7.5 MG PO TB24
7.5000 mg | ORAL_TABLET | Freq: Every day | ORAL | Status: DC
  Administered 2016-08-09 – 2016-08-10 (×2): 7.5 mg via ORAL
  Filled 2016-08-09 (×2): qty 1

## 2016-08-09 MED ORDER — ONDANSETRON HCL 4 MG/2ML IJ SOLN: 4.0000 mg | Freq: Four times a day (QID) | INTRAMUSCULAR | Status: DC | PRN

## 2016-08-09 MED ORDER — DOCUSATE SODIUM 100 MG PO CAPS: 100.0000 mg | ORAL_CAPSULE | Freq: Every day | ORAL | Status: DC | PRN

## 2016-08-09 MED ORDER — DEXTROSE 5 % IV SOLN
1.0000 g | INTRAVENOUS | Status: DC
  Administered 2016-08-09: 1 g via INTRAVENOUS
  Filled 2016-08-09: qty 10

## 2016-08-09 MED ORDER — SODIUM CHLORIDE 0.9 % IV BOLUS (SEPSIS): 1000.0000 mL | Freq: Once | INTRAVENOUS | Status: DC

## 2016-08-09 NOTE — Evaluation (Signed)
Physical Therapy Evaluation Patient Details Name: Dawn Saunders MRN: [id number] DOB: 29-Apr-1922 Today's Date: 08/09/2016   History of Present Illness  82 y.o. female admitted with UTI, sepsis. H/o aneurysm repair  Clinical Impression  Pt admitted with above diagnosis. Pt currently with functional limitations due to the deficits listed below (see PT Problem List). Pt ambulated 14' with RW and min/guard assist. Distance limited by fatigue. Family notes some decline in activity tolerance over past couple months. HHPT recommended.  Pt will benefit from skilled PT to increase their independence and safety with mobility to allow discharge to the venue listed below.       Follow Up Recommendations Home health PT    Equipment Recommendations  None recommended by PT    Recommendations for Other Services       Precautions / Restrictions Precautions Precautions: Fall Precaution Comments: daughter reports pt has had 2 falls in past year Restrictions Weight Bearing Restrictions: No      Mobility  Bed Mobility Overal bed mobility: Modified Independent             General bed mobility comments: used rail  Transfers Overall transfer level: Needs assistance Equipment used: Rolling walker (2 wheeled) Transfers: Sit to/from Stand Sit to Stand: Min assist;From elevated surface         General transfer comment: min A to rise, increased time  Ambulation/Gait Ambulation/Gait assistance: Min guard Ambulation Distance (Feet): 14 Feet Assistive device: Rolling walker (2 wheeled) Gait Pattern/deviations: Decreased step length - right;Decreased step length - left;Trunk flexed   Gait velocity interpretation: at or above normal speed for age/gender General Gait Details: VCs to lift head, pt unable; steady with RW, distance limited by fatigue  Stairs            Wheelchair Mobility    Modified Rankin (Stroke Patients Only)       Balance Overall balance assessment: History  of Falls;Needs assistance   Sitting balance-Leahy Scale: Good     Standing balance support: Bilateral upper extremity supported Standing balance-Leahy Scale: Poor Standing balance comment: relies on BUE support                             Pertinent Vitals/Pain Pain Assessment: No/denies pain    Home Living Family/patient expects to be discharged to:: Private residence Living Arrangements: Children Available Help at Discharge: Family;Available 24 hours/day Type of Home: House Home Access: Stairs to enter   [employer] of Steps: 1 Home Layout: One level Home Equipment: Walker - 2 wheels;Toilet riser;Wheelchair - manual      Prior Function Level of Independence: Needs assistance   Gait / Transfers Assistance Needed: independent with RW, walks short distances in home  ADL's / Homemaking Assistance Needed: assistance for sponge bath        Hand Dominance        Extremity/Trunk Assessment   Upper Extremity Assessment: Overall WFL for tasks assessed           Lower Extremity Assessment: Overall WFL for tasks assessed      Cervical / Trunk Assessment: Kyphotic (kyphotic t-spine, significant forward head)  Communication      Cognition Arousal/Alertness: Awake/alert Behavior During Therapy: WFL for tasks assessed/performed Overall Cognitive Status: Within Functional Limits for tasks assessed                      General Comments      Exercises  Assessment/Plan    PT Assessment Patient needs continued PT services  PT Problem List Decreased mobility;Decreased balance;Decreased activity tolerance          PT Treatment Interventions Gait training;Balance training;Therapeutic exercise;Therapeutic activities;Patient/family education    PT Goals (Current goals can be found in the Care Plan section)  Acute Rehab PT Goals Patient Stated Goal: daughter's goal is for pt to get stronger, be able to hold her head up PT Goal  Formulation: With patient/family Time For Goal Achievement: 08/23/16 Potential to Achieve Goals: Good    Frequency Min 3X/week   Barriers to discharge        Co-evaluation               End of Session Equipment Utilized During Treatment: Gait belt Activity Tolerance: Patient tolerated treatment well Patient left: in chair;with call bell/phone within reach;with chair alarm set;with family/visitor present Nurse Communication: Mobility status    Functional Assessment Tool Used: clinical judgement Functional Limitation: Mobility: Walking and moving around Mobility: Walking and Moving Around Current Status [phone](G8978): At least 20 percent but less than 40 percent impaired, limited or restricted Mobility: Walking and Moving Around Goal Status [phone](G8979): At least 1 percent but less than 20 percent impaired, limited or restricted    Time: 1035-1055 PT Time Calculation (min) (ACUTE ONLY): 20 min   Charges:   PT Evaluation $PT Eval Low Complexity: 1 Procedure     PT G Codes:   PT G-Codes **NOT FOR INPATIENT CLASS** Functional Assessment Tool Used: clinical judgement Functional Limitation: Mobility: Walking and moving around Mobility: Walking and Moving Around Current Status ([id number](G8978): At least 20 percent but less than 40 percent impaired, limited or restricted Mobility: Walking and Moving Around Goal Status [phone](G8979): At least 1 percent but less than 20 percent impaired, limited or restricted    Tamala SerUhlenberg, Kamyah Wilhelmsen Kistler 08/09/2016, 11:13 AM [phone]

## 2016-08-09 NOTE — Progress Notes (Signed)
Dawn Saunders is a 82 y.o. 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 She was admitted earlier today Dr Lorretta HarpXilin Niu, and see his note for detailed H&P.  She was admitted for UTI, urine cultures sent and pending.  As per the daughter, she has lost over 50 lbs over a period of one year, with loss of appetite, but family does not want any malignancy work up at this time due to age. She wanted hermom to be started on appetite stimulant,. Started her on megace today.  They would like to take her home possible tomorrow and I would recommend outpatient palliative care consult for goals of care.  Dawn SnowballVijaya Eliabeth Shoff,MD [phone]

## 2016-08-09 NOTE — Progress Notes (Signed)
Initial Nutrition Assessment  DOCUMENTATION CODES:   Severe malnutrition in context of chronic illness, Underweight  INTERVENTION:  Ensure Enlive po BID, each supplement provides 350 kcal and 20 grams of protein. Will trial this. Daughter believes will make pt too full to eat meals. If so, can try ProStat in food/drinks.  Magic cup TID with meals, each supplement provides 290 kcal and 9 grams of protein. Also recommend giving medicine with Magic Cup instead of applesauce.   Recommend daily MVI in setting of severe malnutrition and poor intake.  Provided and discussed "High Protein and High Calorie Nutrition Therapy" from the Academy of Nutrition and Dietetics.  Recommend liberalizing diet from Heart Healthy to Regular in the setting of severe malnutrition, advanced age, and since many of the foods pt enjoys restricted on current diet. Discussed with Attending.  Consider appetite stimulant if medically appropriate. Daughter requested as decrease in appetite was sudden. Discussed with Attending.  NUTRITION DIAGNOSIS:   Malnutrition (Severe, chronic) related to poor appetite as evidenced by 37 percent weight loss in 1 year, severe depletion of muscle mass, severe depletion of body fat, per patient/family report of poor appetite, energy intake < or equal to 75% for > or equal to 1 month.  GOAL:   Patient will meet greater than or equal to 90% of their needs  MONITOR:   PO intake, Supplement acceptance, Labs, Weight trends, I & O's, Skin  REASON FOR ASSESSMENT:   Consult Assessment of nutrition requirement/status  ASSESSMENT:   82 y.o. 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   One of patient's daughter present at time of assessment (this is not the daughter she lives with). Patient  hard of hearing. She reports appetite decreased about 1 year ago and since then she has been eating much less than normal (only 25-50% of usual intake). UBW was 142 lbs before weight loss (confirmed in chart). She has lost 24 kg (37% body weight) in approximately 1 year per daughter and pt report. Daughter pt lives with prepares al meals. They have tried Boost at home, but pt experienced early satiety and was unable to complete meals. She does take MVI daily. As can see in typical intake provided below, patient consuming almost no protein in diet at home.  Typical Intake (only takes bites of meals prepared): Breakfast - bites oatmeal or biscuit with gravy Lunch - 1/2 tomato sandwich with banana Dinner - steamed vegetables  Meal Completion: 0% per chart; upon talking to daughter she had bites of eggs and potatoes for breakfast this AM  Medications reviewed and include: Miralax, NS @ 100 ml/hr.   Labs reviewed: CBG 107, Sodium 134.  Nutrition-Focused physical exam completed. Findings are severe fat depletion, severe muscle depletion, and no edema.   Discussed plan with RN.   Diet Order:  Diet Heart Room service appropriate? Yes; Fluid consistency: Thin  Skin:  Wound (see comment) (stage II to right sacrum, deep tissue to right buttocks)  Last BM:  08/07/2016  Height:   Ht Readings from Last 1 Encounters:  08/09/16 5\' 6"  (1.676 m)    Weight:   Wt Readings from Last 1 Encounters:  08/09/16 89 lb 1.1 oz (40.4 kg)    Ideal Body Weight:  59.09 kg  BMI:  Body mass index is 14.38 kg/m.  Estimated Nutritional Needs:  Kcal:  1200-1400  Protein:  50-60 grams  Fluid:  1.2-1.4 L/day  EDUCATION NEEDS:   Education needs addressed ("High Protein High Calorie Nutrition Therapy" handout from Academy of Nutrition and dietetics.)  Helane RimaLeanne Aurelius Gildersleeve, MS, RD, LDN Pager: [phone] After Hours Pager: [phone]

## 2016-08-09 NOTE — H&P (Signed)
History and Physical    Dawn Saunders OAC:[id number] DOB: November 13, 1922 DOA: 08/08/2016  Referring MD/NP/PA:   PCP: Lupe Carneyean Mitchell, MD   Patient coming from:  The patient is coming from home.  At baseline, pt is independent for most of ADL.   Chief Complaint: Dysuria, generalized weakness  HPI: Dawn Saunders is a 82 y.o. 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  Patient states that she has generalized weakness in the past several days. She also has dysuria, urinary frequency and burning on urination. She was seen by PCP yesterday, diagnosed as UTI, started Septra. She took one dose of Septra today without significant help. She was found to have low blood pressure 68/35 by her daughter at home. She has nausea, but no vomiting, diarrhea or abdominal pain. Denies chest pain, shortness breath, coughing, fever or chills. No unilateral weakness.  ED Course: pt was found to have WBC 12.8, positive urinalysis with large amount of leukocyte and nitrite, temperature normal, soft blood pressure 99/52, electrolytes renal function okay. Patient is placed on MedSurg bed for observation.  Review of Systems:   General: no fevers, chills, no changes in body weight, has fatigue HEENT: no blurry vision, hearing changes or sore throat Respiratory: no dyspnea, coughing, wheezing CV: no chest pain, no palpitations GI: has nausea, no vomiting, abdominal pain, diarrhea, constipation GU: has dysuria, burning on urination, increased urinary frequency, no hematuria  Ext: no leg edema Neuro: no unilateral weakness, numbness, or tingling, no vision change or hearing loss Skin: no rash, no skin tear. MSK: No muscle spasm, no deformity, no limitation of range of movement in spin Heme: No easy bruising.  Travel history: No recent long distant travel.  Allergy:    Allergies  Allergen Reactions  . Heparin     Made her blood clot?  . Codeine Other (See Comments)    hallucinations  . Iodine Other (See Comments)    unknown  . Pravachol [Pravastatin Sodium] Other (See Comments)    MD said not to take  . Zocor [Simvastatin] Other (See Comments)    MD said not to take    Past Medical History:  Diagnosis Date  . AAA (abdominal aortic aneurysm) (HCC)   . Arthritis   . Cataracts, bilateral   . Clotting disorder (HCC) 09/25/2010  . Diarrhea 09/25/2010  . Dizziness 09/25/2010  . Headache(784.0) 09/25/2010  . Hyperlipidemia   . Hypertension   . Loss of appetite     Past Surgical History:  Procedure Laterality Date  . ARTERIOVENOUS GRAFT PLACEMENT    . PR VEIN BYPASS GRAFT,AORTO-FEM-POP      Social History:  reports that she has quit smoking. Her smoking use included Cigarettes. She smoked 2.00 packs per day. She quit smokeless tobacco use about 46 years ago. She reports that she does not drink alcohol. Her drug history is not on file.  Family History: Reviewed with patient, but patient does not remember any family medical history.  Prior to Admission medications   Medication Sig Start Date End Date Taking? Authorizing Provider  acetaminophen (TYLENOL) 500 MG tablet Take 500 mg by mouth every 6 (six) hours as needed for headache.    Yes Historical Provider, MD  amLODipine (NORVASC) 5 MG tablet Take 2.5 mg by mouth daily.   Yes Historical Provider, MD  docusate sodium (COLACE) 100 MG capsule Take 100 mg by mouth daily  as needed for mild constipation.   Yes Historical Provider, MD  lisinopril-hydrochlorothiazide (PRINZIDE,ZESTORETIC) 10-12.5 MG per tablet Take 1 tablet by mouth daily. 06/02/15  Yes Historical Provider, MD  loperamide (IMODIUM A-D) 2 MG tablet Take 2 mg by mouth 3 (three) times daily as needed for diarrhea or loose stools.    Yes Historical Provider, MD  Multiple Vitamins-Minerals (CENTRUM SILVER PO) Take 1 tablet by mouth daily.      Yes Historical Provider, MD  Polyethyl Glycol-Propyl Glycol (SYSTANE) 0.4-0.3 % GEL Place 1 drop into both eyes daily as needed (dry eyes).    Yes Historical Provider, MD  polyethylene glycol (MIRALAX / GLYCOLAX) packet Take 8.5 g by mouth every other day.    Yes Historical Provider, MD  solifenacin (VESICARE) 10 MG tablet Take 5-10 mg by mouth daily as needed (urination).    Yes Historical Provider, MD  sulfamethoxazole-trimethoprim (BACTRIM DS,SEPTRA DS) 800-160 MG tablet Take 1 tablet by mouth 2 (two) times daily. 08/07/16  Yes Historical Provider, MD    Physical Exam: Vitals:   08/09/16 0100 08/09/16 0130 08/09/16 0200 08/09/16 0259  BP: 101/57 109/57 (!) 96/50 (!) 131/56  Pulse: 90 78 76 80  Resp: 17 17 12 16   Temp:    98.3 F (36.8 C)  TempSrc:    Oral  SpO2: 94% 100% 95% 97%  Weight:    40.4 kg (89 lb 1.1 oz)  Height:    5\' 6"  (1.676 m)   General: Not in acute distress HEENT:       Eyes: PERRL, EOMI, no scleral icterus.       ENT: No discharge from the ears and nose, no pharynx injection, no tonsillar enlargement.        Neck: No JVD, no bruit, no mass felt. Heme: No neck lymph node enlargement. Cardiac: S1/S2, RRR, No murmurs, No gallops or rubs. Respiratory: No rales, wheezing, rhonchi or rubs. GI: Soft, nondistended, nontender, no rebound pain, no organomegaly, BS present. GU: No hematuria Ext: No pitting leg edema bilaterally. 2+DP/PT pulse bilaterally. Musculoskeletal: No joint deformities, No joint redness or warmth, no limitation of ROM in spin. Skin: No rashes.  Neuro: Alert, oriented X3, cranial nerves II-XII grossly intact, moves all extremities normally. Psych: Patient is not psychotic, no suicidal or hemocidal ideation.  Labs on Admission: I have personally reviewed following labs and imaging studies  CBC:  Recent Labs Lab 08/08/16 2303  WBC 12.8*  HGB 13.4  HCT 38.6  MCV 82.5  PLT 164   Basic Metabolic Panel:  Recent Labs Lab 08/08/16 2303  NA  134*  K 3.8  CL 96*  CO2 30  GLUCOSE 114*  BUN 19  CREATININE 0.68  CALCIUM 9.4   GFR: Estimated Creatinine Clearance: 29.2 mL/min (by C-G formula based on SCr of 0.68 mg/dL). Liver Function Tests: No results for input(s): AST, ALT, ALKPHOS, BILITOT, PROT, ALBUMIN in the last 168 hours. No results for input(s): LIPASE, AMYLASE in the last 168 hours. No results for input(s): AMMONIA in the last 168 hours. Coagulation Profile: No results for input(s): INR, PROTIME in the last 168 hours. Cardiac Enzymes: No results for input(s): CKTOTAL, CKMB, CKMBINDEX, TROPONINI in the last 168 hours. BNP (last 3 results) No results for input(s): PROBNP in the last 8760 hours. HbA1C: No results for input(s): HGBA1C in the last 72 hours. CBG:  Recent Labs Lab 08/08/16 2331  GLUCAP 107*   Lipid Profile: No results for input(s): CHOL, HDL, LDLCALC, TRIG, CHOLHDL, LDLDIRECT in the  last 72 hours. Thyroid Function Tests: No results for input(s): TSH, T4TOTAL, FREET4, T3FREE, THYROIDAB in the last 72 hours. Anemia Panel: No results for input(s): VITAMINB12, FOLATE, FERRITIN, TIBC, IRON, RETICCTPCT in the last 72 hours. Urine analysis:    Component Value Date/Time   COLORURINE ORANGE (A) 08/08/2016 2359   APPEARANCEUR CLOUDY (A) 08/08/2016 2359   LABSPEC 1.017 08/08/2016 2359   PHURINE 6.0 08/08/2016 2359   GLUCOSEU NEGATIVE 08/08/2016 2359   HGBUR NEGATIVE 08/08/2016 2359   BILIRUBINUR SMALL (A) 08/08/2016 2359   KETONESUR NEGATIVE 08/08/2016 2359   PROTEINUR 30 (A) 08/08/2016 2359   UROBILINOGEN 0.2 06/16/2015 2040   NITRITE POSITIVE (A) 08/08/2016 2359   LEUKOCYTESUR LARGE (A) 08/08/2016 2359   Sepsis Labs: @LABRCNTIP (procalcitonin:4,lacticidven:4) )No results found for this or any previous visit (from the past 240 hour(s)).   Radiological Exams on Admission: No results found.   EKG: Independently reviewed.  QTC 372, sinus rhythm, LAD, poor R-wave progression, nonspecific T-wave  change.  Assessment/Plan Principal Problem:   UTI (lower urinary tract infection) Active Problems:   H/O repair of dissecting aneurysm of descending thoracic aorta   Hypertension   Protein-calorie malnutrition, moderate (HCC)   Sepsis (HCC)   UTI and sepsis:  Patient has positive urinalysis for UTI and she is asymptomatic. Pt meets criteria for sepsis with leukocytosis and hypotension at home. Currently blood pressures is soft, hemodynamically stable. Lactate is pending.   - place on tele bed for obs - Ceftriaxone by IV - Follow up results of urine and blood cx and amend antibiotic regimen if needed per sensitivity results - prn Zofran for nausea - will get Procalcitonin and trend lactic acid levels per sepsis protocol. - IVF: 500 cc of NS bolus in ED, followed by 100 cc/h   Hypertension: -Hold home blood pressure medication due to hypotension. -IV hydralazine when necessary  Protein-calorie malnutrition, moderate (HCC): -Consult to nutrition   DVT ppx: SCD Code Status: DNR Family Communication: Yes, patient's Daughter at bed side Disposition Plan:  Anticipate discharge back to previous home environment Consults called: none Admission status: medical floor/obs  Date of Service 08/09/2016    Lorretta Harp [facility] Pager [phone]  If 7PM-7AM, please contact night-coverage [email or web address] Password [facility] 08/09/2016, 3:19 AM

## 2016-08-09 NOTE — Care Management Obs Status (Signed)
MEDICARE OBSERVATION STATUS NOTIFICATION   Patient Details  Name: Dawn Saunders MRN: [id number] Date of Birth: May 03, 1922   Medicare Observation Status Notification Given:  Other (see comment)  Pt is confused. MOON letter information given to pt's daughter Marolyn HammockBarbara Warf over the telephone and copy left in pt's room.     Geni BersMcGibboney, Azjah Pardo, RN 08/09/2016, 3:23 PM

## 2016-08-09 NOTE — Progress Notes (Signed)
Daughter Rollene FareBarba Warf [facility]  Referral given to in house rep.

## 2016-08-10 DIAGNOSIS — N390 Urinary tract infection, site not specified: Principal | ICD-10-CM

## 2016-08-10 DIAGNOSIS — I10 Essential (primary) hypertension: Secondary | ICD-10-CM

## 2016-08-10 DIAGNOSIS — L8992 Pressure ulcer of unspecified site, stage 2: Secondary | ICD-10-CM

## 2016-08-10 DIAGNOSIS — E440 Moderate protein-calorie malnutrition: Secondary | ICD-10-CM

## 2016-08-10 LAB — URINE CULTURE

## 2016-08-10 MED ORDER — FOSFOMYCIN TROMETHAMINE 3 G PO PACK
3.0000 g | PACK | Freq: Once | ORAL | Status: AC
  Administered 2016-08-10: 3 g via ORAL
  Filled 2016-08-10: qty 3

## 2016-08-10 MED ORDER — MEGESTROL ACETATE 400 MG/10ML PO SUSP: 400.0000 mg | Freq: Every day | ORAL | 0 refills | Status: DC

## 2016-08-10 MED ORDER — ENSURE ENLIVE PO LIQD: 237.0000 mL | Freq: Two times a day (BID) | ORAL | 0 refills | Status: DC

## 2016-08-10 NOTE — Progress Notes (Signed)
Went over all discharge information with patient and family.  All questions answered.  Pt discharge via wheelchair.

## 2016-08-10 NOTE — Progress Notes (Signed)
Occupational Therapy Evaluation Patient Details Name: Dawn Saunders MRN: [id number] DOB: April 29, 1922 Today's Date: 08/10/2016    History of Present Illness 82 y.o. female admitted with UTI, sepsis. H/o aneurysm repair   Clinical Impression   Pt was admitted for the above.  Pt states she could get up to the bathroom herself PTA. [facility]  Plan is for discharge today    Follow Up Recommendations  Home health OT;Supervision/Assistance - 24 hour    Equipment Recommendations  3 in 1 bedside comode    Recommendations for Other Services       Precautions / Restrictions Precautions Precautions: Fall Restrictions Weight Bearing Restrictions: No      Mobility Bed Mobility               General bed mobility comments: min A due to urgency  Transfers   Equipment used: Rolling walker (2 wheeled) Transfers: Sit to/from Stand Sit to Stand: Min assist;Mod assist         General transfer comment: mod A from high bed; min A from 3:1 commode    Balance           Standing balance support: Bilateral upper extremity supported Standing balance-Leahy Scale: Poor Standing balance comment: initial posterior LOB                            ADL Overall ADL's : Needs assistance/impaired     Grooming: Set up;Sitting   Upper Body Bathing: Set up;Sitting   Lower Body Bathing: Moderate assistance;Sit to/from stand   Upper Body Dressing : Minimal assistance;Sitting   Lower Body Dressing: Maximal assistance;Sit to/from stand   Toilet Transfer: Minimal assistance;BSC;RW;Stand-pivot (initially mod A from bed to stand)   Toileting- Clothing Manipulation and Hygiene: Set up;Sitting/lateral lean         General ADL Comments: [facility] posterior lean initially.  Pt had urgency but made it.  Had only walked 14 feet with PT yesterday. When pt called daughter, I asked whether they would like 3:1 for her, and they  would.      Vision     Perception     Praxis      Pertinent Vitals/Pain Pain Assessment: No/denies pain     Hand Dominance     Extremity/Trunk Assessment Upper Extremity Assessment Upper Extremity Assessment: Overall WFL for tasks assessed       Cervical / Trunk Assessment Cervical / Trunk Assessment: Kyphotic   Communication Communication Communication: HOH   Cognition Arousal/Alertness: Awake/alert Behavior During Therapy: WFL for tasks assessed/performed Overall Cognitive Status: Within Functional Limits for tasks assessed                     General Comments       Exercises       Shoulder Instructions      Home Living Family/patient expects to be discharged to:: Private residence Living Arrangements: Children Available Help at Discharge: Family;Available 24 hours/day                         Home Equipment: Walker - 2 wheels;Toilet riser;Wheelchair - manual          Prior Functioning/Environment Level of Independence: Needs assistance    ADL's / Homemaking Assistance Needed: assistance for sponge bath            OT Problem  List: Decreased strength;Decreased activity tolerance;Impaired balance (sitting and/or standing)   OT Treatment/Interventions:      OT Goals(Current goals can be found in the care plan section) Acute Rehab OT Goals Patient Stated Goal: daughter's: get stronger and hold head up OT Goal Formulation:  (plan is for discharge today)  OT Frequency:     Barriers to D/C:            Co-evaluation              End of Session    Activity Tolerance: Patient limited by fatigue Patient left: in bed;with call bell/phone within reach;with bed alarm set   Time: 1610-9604 OT Time Calculation (min): 16 min Charges:  OT General Charges $OT Visit: 1 Procedure OT Evaluation $OT Eval Low Complexity: 1 Procedure G-Codes:    Merrill Deanda 08/23/16, 12:08 PM Marica Otter,  OTR/L [phone] 2016/08/23

## 2016-08-10 NOTE — Discharge Instructions (Signed)

## 2016-08-10 NOTE — Discharge Summary (Addendum)
Physician Discharge Summary  Aleila Syverson [facility] MWN:[id number] DOB: 09/29/1922 DOA: 08/08/2016  PCP: Lupe Carney, MD  Admit date: 08/08/2016 Discharge date: 08/10/2016  Time spent: 45 minutes  Recommendations for Outpatient Follow-up:  Patient will be discharged to home with home health services.  Patient will need to follow up with primary care provider within one week of discharge. Discuss blood pressure with primary care physician.  Patient should continue medications as prescribed.  Patient should follow a heart healthy diet.   Discharge Diagnoses:  UTI Essential hypertension Moderate protein calorie malnutrition Overactive Bladder  Generalized weakness  Discharge Condition: Stable  Diet recommendation: heart healthy  Filed Weights   08/09/16 0259  Weight: 40.4 kg (89 lb 1.1 oz)    History of present illness:  On 08/09/2016 by Dr. Lorretta Harp Dawn Saunders is a 82 y.o. 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  Patient states that she has generalized weakness in the past several days. She also has dysuria, urinary frequency and burning on urination. She was seen by PCP yesterday, diagnosed as UTI, started Septra. She took one dose of Septra today without significant help. She was found to have low blood pressure 68/35 by her daughter at home. She has nausea, but no vomiting, diarrhea or abdominal pain. Denies chest pain, shortness breath, coughing, fever or chills. No unilateral weakness.  Hospital Course:  UTI -Patient did present with elevated leukocytosis, 12.8. This has now resolved. -Patient did not meet sepsis criteria upon arrival to the ED.  -UA: 6-30 WBCs, positive nitrites, large leukocytes, rare bacteria -Initially placed on ceftriaxone -Urine culture: Multiple species -Unfortunately patient was taking bactrim  before admission  -Given one dose of fosfomycin -Patient has improved quickly.   Essential hypertension -Upon admission, blood pressure was noted to be normal at 110/60. (Supposedly was 68/35 at home) -Lisinopril HCTZ, amlodipine were held. May restart upon discharge. Should discuss with PCP.   Moderate protein calorie malnutrition -Started on megace -Follow up with PCP   Overactive Bladder  -Continue vesicare  Generalized weakness -Possible due to UTI -PT consulted and recommended HH  Pressure ulcer -Stage II, right buttock -Continue wound care  Procedures:  None  Consultations:  None  Discharge Exam: Vitals:   08/09/16 2216 08/10/16 0400  BP: (!) 138/51 (!) 143/54  Pulse: 71 76  Resp: 18 17  Temp: 98.3 F (36.8 C) 98.2 F (36.8 C)     General: Well developed, thin, elderly, NAD  HEENT: NCAT,  mucous membranes moist.  Cardiovascular: S1 S2 auscultated, RRR  Respiratory: Clear to auscultation bilaterally with equal chest rise  Abdomen: Soft, nontender, nondistended, + bowel sounds, +hernia  Extremities: warm dry without cyanosis clubbing or edema  Neuro: AAOx3,nonfocal  Psych: Normal affect and demeanor with intact judgement and insight  Discharge Instructions Discharge Instructions    Discharge instructions    Complete by:  As directed    Patient will be discharged to home with home health services.  Patient will need to follow up with primary care provider within one week of discharge. Discuss blood pressure with primary care physician.  Patient should continue medications as prescribed.  Patient should follow a heart healthy diet.     Current Discharge Medication List    START taking these medications   Details  feeding supplement, ENSURE ENLIVE, (ENSURE ENLIVE) LIQD Take 237 mLs by mouth 2 (two) times daily  between meals. Qty: 60 Bottle, Refills: 0    megestrol (MEGACE) 400 MG/10ML suspension Take 10 mLs (400 mg total) by mouth daily. Qty:  240 mL, Refills: 0      CONTINUE these medications which have NOT CHANGED   Details  acetaminophen (TYLENOL) 500 MG tablet Take 500 mg by mouth every 6 (six) hours as needed for headache.     amLODipine (NORVASC) 5 MG tablet Take 2.5 mg by mouth daily.    docusate sodium (COLACE) 100 MG capsule Take 100 mg by mouth daily as needed for mild constipation.    lisinopril-hydrochlorothiazide (PRINZIDE,ZESTORETIC) 10-12.5 MG per tablet Take 1 tablet by mouth daily. Refills: 12    loperamide (IMODIUM A-D) 2 MG tablet Take 2 mg by mouth 3 (three) times daily as needed for diarrhea or loose stools.     Multiple Vitamins-Minerals (CENTRUM SILVER PO) Take 1 tablet by mouth daily.      Polyethyl Glycol-Propyl Glycol (SYSTANE) 0.4-0.3 % GEL Place 1 drop into both eyes daily as needed (dry eyes).     polyethylene glycol (MIRALAX / GLYCOLAX) packet Take 8.5 g by mouth every other day.     solifenacin (VESICARE) 10 MG tablet Take 5-10 mg by mouth daily as needed (urination).       STOP taking these medications     sulfamethoxazole-trimethoprim (BACTRIM DS,SEPTRA DS) 800-160 MG tablet        Allergies  Allergen Reactions  . Heparin     Made her blood clot?  . Codeine Other (See Comments)    hallucinations  . Iodine Other (See Comments)    unknown  . Pravachol [Pravastatin Sodium] Other (See Comments)    MD said not to take  . Zocor [Simvastatin] Other (See Comments)    MD said not to take   Follow-up Information    Lupe Carney, MD. Schedule an appointment as soon as possible for a visit today.   Specialty:  Family Medicine Why:  Hospital follow up Contact information: [address] [place] [address] [phone]            The results of significant diagnostics from this hospitalization (including imaging, microbiology, ancillary and laboratory) are listed below for reference.    Significant Diagnostic Studies: No results  found.  Microbiology: Recent Results (from the past 240 hour(s))  Urine culture     Status: Abnormal   Collection Time: 08/08/16 11:59 PM  Result Value Ref Range Status   Specimen Description URINE, CLEAN CATCH  Final   Special Requests NONE  Final   Culture MULTIPLE SPECIES PRESENT, SUGGEST RECOLLECTION (A)  Final   Report Status 08/10/2016 FINAL  Final     Labs: Basic Metabolic Panel:  Recent Labs Lab 08/08/16 2303 08/09/16 0349  NA 134* 134*  K 3.8 3.7  CL 96* 99*  CO2 30 27  GLUCOSE 114* 104*  BUN 19 19  CREATININE 0.68 0.54  CALCIUM 9.4 8.6*   Liver Function Tests:  Recent Labs Lab 08/09/16 0329  AST 17  ALT 15  ALKPHOS 55  BILITOT 1.6*  PROT 5.3*  ALBUMIN 2.9*   No results for input(s): LIPASE, AMYLASE in the last 168 hours. No results for input(s): AMMONIA in the last 168 hours. CBC:  Recent Labs Lab 08/08/16 2303 08/09/16 0349  WBC 12.8* 8.7  HGB 13.4 12.3  HCT 38.6 36.7  MCV 82.5 85.0  PLT 164 156   Cardiac Enzymes: No results for input(s): CKTOTAL, CKMB, CKMBINDEX, TROPONINI  in the last 168 hours. BNP: BNP (last 3 results) No results for input(s): BNP in the last 8760 hours.  ProBNP (last 3 results) No results for input(s): PROBNP in the last 8760 hours.  CBG:  Recent Labs Lab 08/08/16 2331  GLUCAP 107*       Signed:  Edsel PetrinMIKHAIL, Jamilet Ambroise  [facility] 08/10/2016, 11:35 AM

## 2016-08-12 DIAGNOSIS — N3281 Overactive bladder: Secondary | ICD-10-CM | POA: Diagnosis not present

## 2016-08-12 DIAGNOSIS — E440 Moderate protein-calorie malnutrition: Secondary | ICD-10-CM | POA: Diagnosis not present

## 2016-08-12 DIAGNOSIS — H548 Legal blindness, as defined in USA: Secondary | ICD-10-CM | POA: Diagnosis not present

## 2016-08-12 DIAGNOSIS — D689 Coagulation defect, unspecified: Secondary | ICD-10-CM | POA: Diagnosis not present

## 2016-08-12 DIAGNOSIS — I10 Essential (primary) hypertension: Secondary | ICD-10-CM | POA: Diagnosis not present

## 2016-08-12 DIAGNOSIS — Z87440 Personal history of urinary (tract) infections: Secondary | ICD-10-CM | POA: Diagnosis not present

## 2016-08-12 DIAGNOSIS — Z87891 Personal history of nicotine dependence: Secondary | ICD-10-CM | POA: Diagnosis not present

## 2016-08-14 DIAGNOSIS — E440 Moderate protein-calorie malnutrition: Secondary | ICD-10-CM | POA: Diagnosis not present

## 2016-08-14 DIAGNOSIS — I10 Essential (primary) hypertension: Secondary | ICD-10-CM | POA: Diagnosis not present

## 2016-08-14 DIAGNOSIS — H548 Legal blindness, as defined in USA: Secondary | ICD-10-CM | POA: Diagnosis not present

## 2016-08-14 DIAGNOSIS — Z87440 Personal history of urinary (tract) infections: Secondary | ICD-10-CM | POA: Diagnosis not present

## 2016-08-14 DIAGNOSIS — D689 Coagulation defect, unspecified: Secondary | ICD-10-CM | POA: Diagnosis not present

## 2016-08-14 DIAGNOSIS — N3281 Overactive bladder: Secondary | ICD-10-CM | POA: Diagnosis not present

## 2016-08-17 DIAGNOSIS — H548 Legal blindness, as defined in USA: Secondary | ICD-10-CM | POA: Diagnosis not present

## 2016-08-17 DIAGNOSIS — Z87440 Personal history of urinary (tract) infections: Secondary | ICD-10-CM | POA: Diagnosis not present

## 2016-08-17 DIAGNOSIS — N3281 Overactive bladder: Secondary | ICD-10-CM | POA: Diagnosis not present

## 2016-08-17 DIAGNOSIS — D689 Coagulation defect, unspecified: Secondary | ICD-10-CM | POA: Diagnosis not present

## 2016-08-17 DIAGNOSIS — E440 Moderate protein-calorie malnutrition: Secondary | ICD-10-CM | POA: Diagnosis not present

## 2016-08-17 DIAGNOSIS — I10 Essential (primary) hypertension: Secondary | ICD-10-CM | POA: Diagnosis not present

## 2016-08-20 DIAGNOSIS — H548 Legal blindness, as defined in USA: Secondary | ICD-10-CM | POA: Diagnosis not present

## 2016-08-20 DIAGNOSIS — Z87440 Personal history of urinary (tract) infections: Secondary | ICD-10-CM | POA: Diagnosis not present

## 2016-08-20 DIAGNOSIS — I10 Essential (primary) hypertension: Secondary | ICD-10-CM | POA: Diagnosis not present

## 2016-08-20 DIAGNOSIS — D689 Coagulation defect, unspecified: Secondary | ICD-10-CM | POA: Diagnosis not present

## 2016-08-20 DIAGNOSIS — N3281 Overactive bladder: Secondary | ICD-10-CM | POA: Diagnosis not present

## 2016-08-20 DIAGNOSIS — E440 Moderate protein-calorie malnutrition: Secondary | ICD-10-CM | POA: Diagnosis not present

## 2016-08-29 DIAGNOSIS — D689 Coagulation defect, unspecified: Secondary | ICD-10-CM | POA: Diagnosis not present

## 2016-08-29 DIAGNOSIS — N3281 Overactive bladder: Secondary | ICD-10-CM | POA: Diagnosis not present

## 2016-08-29 DIAGNOSIS — I10 Essential (primary) hypertension: Secondary | ICD-10-CM | POA: Diagnosis not present

## 2016-08-29 DIAGNOSIS — H548 Legal blindness, as defined in USA: Secondary | ICD-10-CM | POA: Diagnosis not present

## 2016-08-29 DIAGNOSIS — E440 Moderate protein-calorie malnutrition: Secondary | ICD-10-CM | POA: Diagnosis not present

## 2016-08-29 DIAGNOSIS — Z87440 Personal history of urinary (tract) infections: Secondary | ICD-10-CM | POA: Diagnosis not present

## 2016-09-11 DIAGNOSIS — N390 Urinary tract infection, site not specified: Secondary | ICD-10-CM | POA: Diagnosis not present

## 2016-09-20 DIAGNOSIS — E440 Moderate protein-calorie malnutrition: Secondary | ICD-10-CM | POA: Diagnosis not present

## 2016-09-20 DIAGNOSIS — H548 Legal blindness, as defined in USA: Secondary | ICD-10-CM | POA: Diagnosis not present

## 2016-09-20 DIAGNOSIS — I10 Essential (primary) hypertension: Secondary | ICD-10-CM | POA: Diagnosis not present

## 2016-09-20 DIAGNOSIS — D689 Coagulation defect, unspecified: Secondary | ICD-10-CM | POA: Diagnosis not present

## 2016-09-20 DIAGNOSIS — Z87440 Personal history of urinary (tract) infections: Secondary | ICD-10-CM | POA: Diagnosis not present

## 2016-09-20 DIAGNOSIS — N3281 Overactive bladder: Secondary | ICD-10-CM | POA: Diagnosis not present

## 2016-10-10 DIAGNOSIS — N3281 Overactive bladder: Secondary | ICD-10-CM | POA: Diagnosis not present

## 2016-10-10 DIAGNOSIS — I10 Essential (primary) hypertension: Secondary | ICD-10-CM | POA: Diagnosis not present

## 2016-10-10 DIAGNOSIS — Z87440 Personal history of urinary (tract) infections: Secondary | ICD-10-CM | POA: Diagnosis not present

## 2016-10-10 DIAGNOSIS — H548 Legal blindness, as defined in USA: Secondary | ICD-10-CM | POA: Diagnosis not present

## 2016-10-10 DIAGNOSIS — D689 Coagulation defect, unspecified: Secondary | ICD-10-CM | POA: Diagnosis not present

## 2016-10-10 DIAGNOSIS — E440 Moderate protein-calorie malnutrition: Secondary | ICD-10-CM | POA: Diagnosis not present

## 2016-10-23 DIAGNOSIS — E46 Unspecified protein-calorie malnutrition: Secondary | ICD-10-CM | POA: Diagnosis not present

## 2016-10-23 DIAGNOSIS — I10 Essential (primary) hypertension: Secondary | ICD-10-CM | POA: Diagnosis not present

## 2016-10-23 DIAGNOSIS — R531 Weakness: Secondary | ICD-10-CM | POA: Diagnosis not present

## 2016-10-23 DIAGNOSIS — M150 Primary generalized (osteo)arthritis: Secondary | ICD-10-CM | POA: Diagnosis not present

## 2016-10-23 DIAGNOSIS — K5900 Constipation, unspecified: Secondary | ICD-10-CM | POA: Diagnosis not present

## 2016-10-23 DIAGNOSIS — Z23 Encounter for immunization: Secondary | ICD-10-CM | POA: Diagnosis not present

## 2016-10-23 DIAGNOSIS — N3281 Overactive bladder: Secondary | ICD-10-CM | POA: Diagnosis not present

## 2017-04-23 DIAGNOSIS — K5900 Constipation, unspecified: Secondary | ICD-10-CM | POA: Diagnosis not present

## 2017-04-23 DIAGNOSIS — N3281 Overactive bladder: Secondary | ICD-10-CM | POA: Diagnosis not present

## 2017-04-23 DIAGNOSIS — I10 Essential (primary) hypertension: Secondary | ICD-10-CM | POA: Diagnosis not present

## 2017-04-23 DIAGNOSIS — M150 Primary generalized (osteo)arthritis: Secondary | ICD-10-CM | POA: Diagnosis not present

## 2017-04-23 DIAGNOSIS — R531 Weakness: Secondary | ICD-10-CM | POA: Diagnosis not present

## 2017-04-23 DIAGNOSIS — E46 Unspecified protein-calorie malnutrition: Secondary | ICD-10-CM | POA: Diagnosis not present

## 2017-08-01 ENCOUNTER — Emergency Department (HOSPITAL_COMMUNITY)
Admission: EM | Admit: 2017-08-01 | Discharge: 2017-08-01 | Disposition: A | Discharge status: Home / Self Care | Payer: Medicare Other | Attending: Emergency Medicine | Admitting: Emergency Medicine

## 2017-08-01 ENCOUNTER — Encounter (HOSPITAL_COMMUNITY): Payer: Self-pay

## 2017-08-01 ENCOUNTER — Emergency Department (HOSPITAL_COMMUNITY): Payer: Medicare Other

## 2017-08-01 DIAGNOSIS — S8992XA Unspecified injury of left lower leg, initial encounter: Secondary | ICD-10-CM | POA: Diagnosis not present

## 2017-08-01 DIAGNOSIS — W010XXA Fall on same level from slipping, tripping and stumbling without subsequent striking against object, initial encounter: Secondary | ICD-10-CM | POA: Insufficient documentation

## 2017-08-01 DIAGNOSIS — M79602 Pain in left arm: Secondary | ICD-10-CM | POA: Diagnosis not present

## 2017-08-01 DIAGNOSIS — Z87891 Personal history of nicotine dependence: Secondary | ICD-10-CM | POA: Diagnosis not present

## 2017-08-01 DIAGNOSIS — W19XXXA Unspecified fall, initial encounter: Secondary | ICD-10-CM

## 2017-08-01 DIAGNOSIS — I10 Essential (primary) hypertension: Secondary | ICD-10-CM | POA: Diagnosis not present

## 2017-08-01 DIAGNOSIS — T148XXA Other injury of unspecified body region, initial encounter: Secondary | ICD-10-CM | POA: Diagnosis not present

## 2017-08-01 DIAGNOSIS — M7989 Other specified soft tissue disorders: Secondary | ICD-10-CM | POA: Diagnosis not present

## 2017-08-01 DIAGNOSIS — M25461 Effusion, right knee: Secondary | ICD-10-CM | POA: Diagnosis not present

## 2017-08-01 DIAGNOSIS — I959 Hypotension, unspecified: Secondary | ICD-10-CM | POA: Diagnosis not present

## 2017-08-01 DIAGNOSIS — S8991XA Unspecified injury of right lower leg, initial encounter: Secondary | ICD-10-CM | POA: Diagnosis not present

## 2017-08-01 DIAGNOSIS — G8911 Acute pain due to trauma: Secondary | ICD-10-CM | POA: Diagnosis not present

## 2017-08-01 DIAGNOSIS — N390 Urinary tract infection, site not specified: Secondary | ICD-10-CM | POA: Diagnosis not present

## 2017-08-01 DIAGNOSIS — Z79899 Other long term (current) drug therapy: Secondary | ICD-10-CM | POA: Diagnosis not present

## 2017-08-01 DIAGNOSIS — M25561 Pain in right knee: Secondary | ICD-10-CM | POA: Diagnosis not present

## 2017-08-01 DIAGNOSIS — E860 Dehydration: Secondary | ICD-10-CM | POA: Diagnosis not present

## 2017-08-01 DIAGNOSIS — M791 Myalgia: Secondary | ICD-10-CM | POA: Diagnosis not present

## 2017-08-01 DIAGNOSIS — Y92002 Bathroom of unspecified non-institutional (private) residence single-family (private) house as the place of occurrence of the external cause: Secondary | ICD-10-CM | POA: Diagnosis not present

## 2017-08-01 NOTE — ED Notes (Signed)
Marolyn HammockBarbara Warf [phone] call with update.

## 2017-08-01 NOTE — ED Triage Notes (Signed)
Fall in bathroom fell from standing position right leg swollen and left arm pain no deformity noted no neck or back pain voiced no LOC witnessed fall per family.

## 2017-08-01 NOTE — Discharge Planning (Signed)
Marvyn Torrez J. Jermone Geister, RN, BSN, NCM [phone] Spoke with pt at bedside regarding discharge planning for Home Health Services. Offered pt list of home health agencies to choose from.  Pt chose [facility] to render services. Karen of [facility] notified. Patient made aware that [facility] will be in contact in 24-48 hours.  No DME needs identified at this time.  

## 2017-08-01 NOTE — ED Notes (Addendum)
Pt ambulated with walker; steady gait noted. Pt awaiting case management consult.

## 2017-08-01 NOTE — ED Notes (Signed)
[facility] notified patient is ready for transport.

## 2017-08-01 NOTE — ED Provider Notes (Signed)
[facility]-EMERGENCY DEPT Provider Note   CSN: [id number] Arrival date & time: 08/01/17  7829   History   Chief Complaint Chief Complaint  Patient presents with  . Fall   HPI Dawn Saunders is a 83 y.o. female.  With PMH of AAA s/p repair, arthritis, HTN, and HLD who is presenting for evaluation of R knee pain and L arm pain after a mechanical fall two days ago. The patient states she was walking to the bathroom with her walker when she finished using the restroom, stood up, and then had an accidental fall from standing. She states that she usually uses a walker and wheelchair at home to make sure she doesn't fall and isn't quite sure why she fell. She denies hitting her head after the fall and states that her head does not hurt at all. Also denies syncope, palpitations, headaches, dizziness, and SOB prior to fall. States that she was doing fine after the fall, but her daughter who lives with her wanted her to come to the ED for evaluation since her R knee has become very swollen and isn't getting better. Patient denies difficulty moving her R knee, but states that she has pain in the muscles of her upper arm when she raises her arm and when her blood pressure cuff inflates during the interview. She states that the pain improves when the blood pressure cuff deflates and when she doesn't have to move her arm too much. She has no other complaints.   At baseline the patient walks with walker but mainly uses wheelchair in her home. The patient states that she sometimes has difficulty getting around and that she has fallen once before. She states that she lives with her daughter who is her caretaker, but her daughter is on home oxygen and has trouble helping her get around the house as a result. The patient states she is afraid to come into the hospital and that she wants to go home.     Past Medical History:  Diagnosis Date  . AAA (abdominal aortic aneurysm) (HCC)   . Arthritis   . Cataracts,  bilateral   . Clotting disorder (HCC) 09/25/2010  . Diarrhea 09/25/2010  . Dizziness 09/25/2010  . Headache(784.0) 09/25/2010  . Hyperlipidemia   . Hypertension   . Loss of appetite     Patient Active Problem List   Diagnosis Date Noted  . UTI (lower urinary tract infection) 08/09/2016  . Protein-calorie malnutrition, moderate (HCC) 08/09/2016  . Sepsis (HCC) 08/09/2016  . Pressure injury of skin 08/09/2016  . Pressure ulcer stage II 08/09/2016  . Hypertension   . 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 Descending thoracic aortic dissection (HCC) 03/10/2012  . 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 ARTERIOVENOUS GRAFT PLACEMENT    . PR VEIN BYPASS GRAFT,AORTO-FEM-POP      OB History    No data available      Home Medications    Prior to Admission medications   Medication Sig Start Date End Date Taking? Authorizing Provider  lisinopril-hydrochlorothiazide (PRINZIDE,ZESTORETIC) 20-25 MG tablet Take 1 tablet by mouth daily. 06/11/17  Yes [provider]  loperamide (IMODIUM A-D) 2 MG tablet Take 2 mg by mouth 3 (three) times daily as needed for diarrhea or loose stools.    Yes [provider]  Multiple Vitamins-Minerals (CENTRUM SILVER PO) Take 1 tablet by mouth daily.     Yes  [provider]  acetaminophen (TYLENOL) 500 MG tablet Take 500 mg by mouth every 6 (six) hours as needed for headache.     [provider]  amLODipine (NORVASC) 5 MG tablet Take 2.5 mg by mouth daily.    [provider]  docusate sodium (COLACE) 100 MG capsule Take 100 mg by mouth daily as needed for mild constipation.    [provider]  feeding supplement, ENSURE ENLIVE, (ENSURE ENLIVE) LIQD Take 237 mLs by mouth 2 (two) times daily between meals. 08/10/16   Mikhail, Nita SellsMaryann, DO  megestrol (MEGACE) 400 MG/10ML suspension Take 10 mLs (400 mg total)  by mouth daily. 08/11/16   Mikhail, Nita SellsMaryann, DO  Polyethyl Glycol-Propyl Glycol (SYSTANE) 0.4-0.3 % GEL Place 1 drop into both eyes daily as needed (dry eyes).     [provider]  polyethylene glycol (MIRALAX / GLYCOLAX) packet Take 8.5 g by mouth every other day.     [provider]  solifenacin (VESICARE) 10 MG tablet Take 5-10 mg by mouth daily as needed (urination).     [provider]    Family History History reviewed. No pertinent family history.  Social History Social History  Substance Use Topics  . Smoking status: Former Smoker    Packs/day: 2.00    Types: Cigarettes  . Smokeless tobacco: Former NeurosurgeonUser    Quit date: 11/19/1969  . Alcohol use No     Allergies   Heparin; Codeine; Iodine; Pravachol [pravastatin sodium]; and Zocor [simvastatin]   Review of Systems Review of Systems  Constitutional: Negative for chills and diaphoresis.  HENT: Negative for congestion, sinus pain, sinus pressure and sore throat.   Respiratory: Negative for cough and shortness of breath.   Cardiovascular: Negative for chest pain and palpitations.  Gastrointestinal: Negative for abdominal distention, abdominal pain, nausea and vomiting.  Genitourinary: Negative for dysuria, flank pain, frequency and urgency.  Musculoskeletal: Positive for joint swelling (R Knee) and myalgias (Left arm). Negative for neck pain.  Skin: Negative for wound.  Neurological: Negative for dizziness, light-headedness and headaches.    Physical Exam Updated Vital Signs BP (!) 170/101 (BP Location: Left Arm)   Pulse 83   Temp 98 F (36.7 C) (Oral)   Resp 16   Ht 5\' 2"  (1.575 m)   Wt 40.4 kg (89 lb)   SpO2 95%   BMI 16.28 kg/m   Physical Exam  Constitutional:  Thin, frail appearing woman sitting in bed in no acute distress  HENT:  Mouth/Throat: Oropharynx is clear and moist. No oropharyngeal exudate.  Cardiovascular: Normal rate, regular rhythm and intact distal pulses.  Exam  reveals no friction rub.   No murmur heard. Pulmonary/Chest: Effort normal. No respiratory distress. She has no wheezes.  Abdominal: Soft. She exhibits no distension and no mass. There is no tenderness. There is no guarding.  Musculoskeletal: She exhibits no deformity (on L shoulder compared to R).  Significant swelling on medial aspect of R Knee with overlying ecchymosis present. No tenderness with palpation and R knee ROM similar to L knee ROM. Patient has tenderness with palpation of L biceps and triceps. Patient has limited UE strength at baseline which limits ROM testing on L and R. Normal ROM on R and L shoulder with passive movement.   Skin: Skin is warm and dry. Capillary refill takes 2 to 3 seconds. No rash noted. No erythema.   ED Treatments / Results  Labs (all labs ordered are listed, but only abnormal results are displayed) Labs  Reviewed - No data to display  EKG  EKG Interpretation None      Radiology Dg Knee Complete 4 Views Left  Result Date: 08/01/2017 CLINICAL DATA:  Status post recent falls EXAM: RIGHT KNEE - COMPLETE 4+ VIEW; LEFT KNEE - COMPLETE 4+ VIEW COMPARISON:  Limited views of the right knee from by right tibia and fibula series of September 30, 2015 FINDINGS: Right knee: The bones are subjectively osteopenic. There is moderate narrowing of the medial and lateral joint compartments. There is no acute fracture or dislocation. There is mild beaking of the tibial spines. The patellofemoral joint is grossly normal. There is some soft tissue swelling medially. There is popliteal artery calcification. Left knee: The bones are subjectively osteopenic. There is moderate to severe joint space loss of the lateral compartment. The medial compartment space is preserved. Large osteophytes arise from the periphery of the articular margin of the lateral femoral condyle spurs arise from the articular margins of the patella. There is no acute fracture or dislocation. There is popliteal  artery calcification. And lateral tibial plateau. IMPRESSION: Moderate to severe osteoarthritic joint space loss of the lateral compartment of the left knee. Milder changes elsewhere. No acute fracture or dislocation. Moderate narrowing of the medial and lateral joint spaces of the right knee. No acute fracture or dislocation. Electronically Signed   By: David  [place] M.D.   On: 08/01/2017 07:40   Dg Knee Complete 4 Views Right  Result Date: 08/01/2017 CLINICAL DATA:  Status post recent falls EXAM: RIGHT KNEE - COMPLETE 4+ VIEW; LEFT KNEE - COMPLETE 4+ VIEW COMPARISON:  Limited views of the right knee from by right tibia and fibula series of September 30, 2015 FINDINGS: Right knee: The bones are subjectively osteopenic. There is moderate narrowing of the medial and lateral joint compartments. There is no acute fracture or dislocation. There is mild beaking of the tibial spines. The patellofemoral joint is grossly normal. There is some soft tissue swelling medially. There is popliteal artery calcification. Left knee: The bones are subjectively osteopenic. There is moderate to severe joint space loss of the lateral compartment. The medial compartment space is preserved. Large osteophytes arise from the periphery of the articular margin of the lateral femoral condyle spurs arise from the articular margins of the patella. There is no acute fracture or dislocation. There is popliteal artery calcification. And lateral tibial plateau. IMPRESSION: Moderate to severe osteoarthritic joint space loss of the lateral compartment of the left knee. Milder changes elsewhere. No acute fracture or dislocation. Moderate narrowing of the medial and lateral joint spaces of the right knee. No acute fracture or dislocation. Electronically Signed   By: David  [place] M.D.   On: 08/01/2017 07:40    Procedures Procedures (including critical care time)  Medications Ordered in ED Medications - No data to display   Initial  Impression / Assessment and Plan / ED Course  I have reviewed the triage vital signs and the nursing notes.  Pertinent labs & imaging results that were available during my care of the patient were reviewed by me and considered in my medical decision making (see chart for details).  Patient presents after mechanical fall from standing. Patient denies hitting head, LOC, and current headache. Patient not on blood thinners and no overt deformities which would suggest need for head imaging at this time. The patient complains of L upper extremity pain, which is mainly associated with inflation of BP cuff during interview. The pain subsides with removal of BP  cuff. No overt bruising, swelling, or L shoulder deformity. No ROM deficits on exam of L shoulder compared to R. No tenderness with palpation of L shoulder joint. Given clinical exam, do not think she needs L shoulder imaging to evaluate for fracture or dislocation.   Patient's R knee significantly swollen with ecchymosis present, but no pain with passive movement on physical exam or limited ROM compared to L knee. Imaging does not show acute fractures but does show evidence of stable OA. Patient does not endorse current pain, but states she is nervous about trying to walk. Will ambulate patient with walker, which she uses at baseline, to see if she is stable for discharge.   Plan to call daughter to get better assessment of baseline function. Will also consult case management to see if patient qualifies for home health PT/OT to see if she can safely ambulate at home.   Final Clinical Impressions(s) / ED Diagnoses   Final diagnoses:  Fall, initial encounter   Patient ambulated safely on her own with use of walker in ED. She denied pain with ambulation and states she is at baseline. The patient states that she wants to go home and is stable for discharge. The writer spoke with daughter over phone and she agreed that mother would benefit from home health  services to help with strength training and safety training with walking devices.   Patient encouraged to follow up with PCP regarding ED visit and usefulness of home health services after discharge.   New Prescriptions New Prescriptions   No medications on file     Rozann Lesches, MD 08/01/17 0940    Melene Plan, DO 08/01/17 0957    Melene Plan, DO 08/01/17 [phone]

## 2017-08-01 NOTE — ED Notes (Signed)
Additional fall reported 2 days ago. Pt not seen for previous fall. Significant bruising and swelling noted on right knee.

## 2017-08-01 NOTE — Discharge Instructions (Signed)
You were seen in the ED to evaluate your injuries after a recent fall. Your imaging did not reveal an acute fracture in your knees and your physical exam was reassuring for no injuries elsewhere. Please follow up with your primary care physician regarding your recent ED visit.   We ordered home health services, including physical and occupational therapy, on discharge. We want them to evaluate you as you expressed difficulty getting around the house with assistive devices and having trouble completing activities of daily living, such as going to the bathroom.They will come to your house and evaluate you to see if you would benefit from their services.

## 2017-08-06 DIAGNOSIS — S8001XD Contusion of right knee, subsequent encounter: Secondary | ICD-10-CM | POA: Diagnosis not present

## 2017-08-06 DIAGNOSIS — E440 Moderate protein-calorie malnutrition: Secondary | ICD-10-CM | POA: Diagnosis not present

## 2017-08-06 DIAGNOSIS — Z9181 History of falling: Secondary | ICD-10-CM | POA: Diagnosis not present

## 2017-08-06 DIAGNOSIS — M25561 Pain in right knee: Secondary | ICD-10-CM | POA: Diagnosis not present

## 2017-08-07 DIAGNOSIS — E440 Moderate protein-calorie malnutrition: Secondary | ICD-10-CM | POA: Diagnosis not present

## 2017-08-07 DIAGNOSIS — I10 Essential (primary) hypertension: Secondary | ICD-10-CM | POA: Diagnosis not present

## 2017-08-07 DIAGNOSIS — Z9181 History of falling: Secondary | ICD-10-CM | POA: Diagnosis not present

## 2017-08-07 DIAGNOSIS — I739 Peripheral vascular disease, unspecified: Secondary | ICD-10-CM | POA: Diagnosis not present

## 2017-08-07 DIAGNOSIS — E46 Unspecified protein-calorie malnutrition: Secondary | ICD-10-CM | POA: Diagnosis not present

## 2017-08-07 DIAGNOSIS — M25561 Pain in right knee: Secondary | ICD-10-CM | POA: Diagnosis not present

## 2017-08-07 DIAGNOSIS — S8001XD Contusion of right knee, subsequent encounter: Secondary | ICD-10-CM | POA: Diagnosis not present

## 2017-08-07 DIAGNOSIS — N3281 Overactive bladder: Secondary | ICD-10-CM | POA: Diagnosis not present

## 2017-08-09 DIAGNOSIS — E46 Unspecified protein-calorie malnutrition: Secondary | ICD-10-CM | POA: Diagnosis not present

## 2017-08-09 DIAGNOSIS — I739 Peripheral vascular disease, unspecified: Secondary | ICD-10-CM | POA: Diagnosis not present

## 2017-08-09 DIAGNOSIS — N3281 Overactive bladder: Secondary | ICD-10-CM | POA: Diagnosis not present

## 2017-08-09 DIAGNOSIS — I10 Essential (primary) hypertension: Secondary | ICD-10-CM | POA: Diagnosis not present

## 2017-08-12 DIAGNOSIS — N3281 Overactive bladder: Secondary | ICD-10-CM | POA: Diagnosis not present

## 2017-08-12 DIAGNOSIS — E46 Unspecified protein-calorie malnutrition: Secondary | ICD-10-CM | POA: Diagnosis not present

## 2017-08-12 DIAGNOSIS — I739 Peripheral vascular disease, unspecified: Secondary | ICD-10-CM | POA: Diagnosis not present

## 2017-08-12 DIAGNOSIS — I10 Essential (primary) hypertension: Secondary | ICD-10-CM | POA: Diagnosis not present

## 2017-08-13 DIAGNOSIS — I10 Essential (primary) hypertension: Secondary | ICD-10-CM | POA: Diagnosis not present

## 2017-08-13 DIAGNOSIS — I739 Peripheral vascular disease, unspecified: Secondary | ICD-10-CM | POA: Diagnosis not present

## 2017-08-13 DIAGNOSIS — N3281 Overactive bladder: Secondary | ICD-10-CM | POA: Diagnosis not present

## 2017-08-13 DIAGNOSIS — E46 Unspecified protein-calorie malnutrition: Secondary | ICD-10-CM | POA: Diagnosis not present

## 2017-08-15 DIAGNOSIS — N3281 Overactive bladder: Secondary | ICD-10-CM | POA: Diagnosis not present

## 2017-08-15 DIAGNOSIS — I739 Peripheral vascular disease, unspecified: Secondary | ICD-10-CM | POA: Diagnosis not present

## 2017-08-15 DIAGNOSIS — I10 Essential (primary) hypertension: Secondary | ICD-10-CM | POA: Diagnosis not present

## 2017-08-15 DIAGNOSIS — E46 Unspecified protein-calorie malnutrition: Secondary | ICD-10-CM | POA: Diagnosis not present

## 2017-08-16 DIAGNOSIS — I739 Peripheral vascular disease, unspecified: Secondary | ICD-10-CM | POA: Diagnosis not present

## 2017-08-16 DIAGNOSIS — E46 Unspecified protein-calorie malnutrition: Secondary | ICD-10-CM | POA: Diagnosis not present

## 2017-08-16 DIAGNOSIS — N3281 Overactive bladder: Secondary | ICD-10-CM | POA: Diagnosis not present

## 2017-08-16 DIAGNOSIS — I10 Essential (primary) hypertension: Secondary | ICD-10-CM | POA: Diagnosis not present

## 2017-08-19 DIAGNOSIS — I10 Essential (primary) hypertension: Secondary | ICD-10-CM | POA: Diagnosis not present

## 2017-08-19 DIAGNOSIS — E46 Unspecified protein-calorie malnutrition: Secondary | ICD-10-CM | POA: Diagnosis not present

## 2017-08-19 DIAGNOSIS — N3281 Overactive bladder: Secondary | ICD-10-CM | POA: Diagnosis not present

## 2017-08-19 DIAGNOSIS — I739 Peripheral vascular disease, unspecified: Secondary | ICD-10-CM | POA: Diagnosis not present

## 2017-08-20 DIAGNOSIS — N3281 Overactive bladder: Secondary | ICD-10-CM | POA: Diagnosis not present

## 2017-08-20 DIAGNOSIS — I10 Essential (primary) hypertension: Secondary | ICD-10-CM | POA: Diagnosis not present

## 2017-08-20 DIAGNOSIS — E46 Unspecified protein-calorie malnutrition: Secondary | ICD-10-CM | POA: Diagnosis not present

## 2017-08-20 DIAGNOSIS — I739 Peripheral vascular disease, unspecified: Secondary | ICD-10-CM | POA: Diagnosis not present

## 2017-08-21 DIAGNOSIS — I739 Peripheral vascular disease, unspecified: Secondary | ICD-10-CM | POA: Diagnosis not present

## 2017-08-21 DIAGNOSIS — E46 Unspecified protein-calorie malnutrition: Secondary | ICD-10-CM | POA: Diagnosis not present

## 2017-08-21 DIAGNOSIS — I10 Essential (primary) hypertension: Secondary | ICD-10-CM | POA: Diagnosis not present

## 2017-08-21 DIAGNOSIS — N3281 Overactive bladder: Secondary | ICD-10-CM | POA: Diagnosis not present

## 2017-08-23 DIAGNOSIS — I739 Peripheral vascular disease, unspecified: Secondary | ICD-10-CM | POA: Diagnosis not present

## 2017-08-23 DIAGNOSIS — I10 Essential (primary) hypertension: Secondary | ICD-10-CM | POA: Diagnosis not present

## 2017-08-23 DIAGNOSIS — N3281 Overactive bladder: Secondary | ICD-10-CM | POA: Diagnosis not present

## 2017-08-23 DIAGNOSIS — E46 Unspecified protein-calorie malnutrition: Secondary | ICD-10-CM | POA: Diagnosis not present

## 2017-08-26 DIAGNOSIS — I739 Peripheral vascular disease, unspecified: Secondary | ICD-10-CM | POA: Diagnosis not present

## 2017-08-26 DIAGNOSIS — E46 Unspecified protein-calorie malnutrition: Secondary | ICD-10-CM | POA: Diagnosis not present

## 2017-08-26 DIAGNOSIS — I10 Essential (primary) hypertension: Secondary | ICD-10-CM | POA: Diagnosis not present

## 2017-08-26 DIAGNOSIS — N3281 Overactive bladder: Secondary | ICD-10-CM | POA: Diagnosis not present

## 2017-08-28 DIAGNOSIS — N3281 Overactive bladder: Secondary | ICD-10-CM | POA: Diagnosis not present

## 2017-08-28 DIAGNOSIS — E46 Unspecified protein-calorie malnutrition: Secondary | ICD-10-CM | POA: Diagnosis not present

## 2017-08-28 DIAGNOSIS — I739 Peripheral vascular disease, unspecified: Secondary | ICD-10-CM | POA: Diagnosis not present

## 2017-08-28 DIAGNOSIS — I10 Essential (primary) hypertension: Secondary | ICD-10-CM | POA: Diagnosis not present

## 2017-08-30 DIAGNOSIS — I739 Peripheral vascular disease, unspecified: Secondary | ICD-10-CM | POA: Diagnosis not present

## 2017-08-30 DIAGNOSIS — N3281 Overactive bladder: Secondary | ICD-10-CM | POA: Diagnosis not present

## 2017-08-30 DIAGNOSIS — I10 Essential (primary) hypertension: Secondary | ICD-10-CM | POA: Diagnosis not present

## 2017-08-30 DIAGNOSIS — E46 Unspecified protein-calorie malnutrition: Secondary | ICD-10-CM | POA: Diagnosis not present

## 2017-09-02 DIAGNOSIS — I10 Essential (primary) hypertension: Secondary | ICD-10-CM | POA: Diagnosis not present

## 2017-09-02 DIAGNOSIS — I739 Peripheral vascular disease, unspecified: Secondary | ICD-10-CM | POA: Diagnosis not present

## 2017-09-02 DIAGNOSIS — E46 Unspecified protein-calorie malnutrition: Secondary | ICD-10-CM | POA: Diagnosis not present

## 2017-09-02 DIAGNOSIS — N3281 Overactive bladder: Secondary | ICD-10-CM | POA: Diagnosis not present

## 2017-09-03 DIAGNOSIS — I739 Peripheral vascular disease, unspecified: Secondary | ICD-10-CM | POA: Diagnosis not present

## 2017-09-03 DIAGNOSIS — E46 Unspecified protein-calorie malnutrition: Secondary | ICD-10-CM | POA: Diagnosis not present

## 2017-09-03 DIAGNOSIS — N3281 Overactive bladder: Secondary | ICD-10-CM | POA: Diagnosis not present

## 2017-09-03 DIAGNOSIS — I10 Essential (primary) hypertension: Secondary | ICD-10-CM | POA: Diagnosis not present

## 2017-09-04 DIAGNOSIS — E46 Unspecified protein-calorie malnutrition: Secondary | ICD-10-CM | POA: Diagnosis not present

## 2017-09-04 DIAGNOSIS — N3281 Overactive bladder: Secondary | ICD-10-CM | POA: Diagnosis not present

## 2017-09-04 DIAGNOSIS — I739 Peripheral vascular disease, unspecified: Secondary | ICD-10-CM | POA: Diagnosis not present

## 2017-09-04 DIAGNOSIS — I10 Essential (primary) hypertension: Secondary | ICD-10-CM | POA: Diagnosis not present

## 2017-09-05 DIAGNOSIS — N3281 Overactive bladder: Secondary | ICD-10-CM | POA: Diagnosis not present

## 2017-09-05 DIAGNOSIS — I739 Peripheral vascular disease, unspecified: Secondary | ICD-10-CM | POA: Diagnosis not present

## 2017-09-05 DIAGNOSIS — E46 Unspecified protein-calorie malnutrition: Secondary | ICD-10-CM | POA: Diagnosis not present

## 2017-09-05 DIAGNOSIS — I10 Essential (primary) hypertension: Secondary | ICD-10-CM | POA: Diagnosis not present

## 2017-09-06 DIAGNOSIS — E46 Unspecified protein-calorie malnutrition: Secondary | ICD-10-CM | POA: Diagnosis not present

## 2017-09-06 DIAGNOSIS — N3281 Overactive bladder: Secondary | ICD-10-CM | POA: Diagnosis not present

## 2017-09-06 DIAGNOSIS — I739 Peripheral vascular disease, unspecified: Secondary | ICD-10-CM | POA: Diagnosis not present

## 2017-09-06 DIAGNOSIS — I10 Essential (primary) hypertension: Secondary | ICD-10-CM | POA: Diagnosis not present

## 2017-09-09 DIAGNOSIS — E46 Unspecified protein-calorie malnutrition: Secondary | ICD-10-CM | POA: Diagnosis not present

## 2017-09-09 DIAGNOSIS — N3281 Overactive bladder: Secondary | ICD-10-CM | POA: Diagnosis not present

## 2017-09-09 DIAGNOSIS — I10 Essential (primary) hypertension: Secondary | ICD-10-CM | POA: Diagnosis not present

## 2017-09-09 DIAGNOSIS — I739 Peripheral vascular disease, unspecified: Secondary | ICD-10-CM | POA: Diagnosis not present

## 2017-09-11 DIAGNOSIS — N3281 Overactive bladder: Secondary | ICD-10-CM | POA: Diagnosis not present

## 2017-09-11 DIAGNOSIS — I10 Essential (primary) hypertension: Secondary | ICD-10-CM | POA: Diagnosis not present

## 2017-09-11 DIAGNOSIS — I739 Peripheral vascular disease, unspecified: Secondary | ICD-10-CM | POA: Diagnosis not present

## 2017-09-11 DIAGNOSIS — E46 Unspecified protein-calorie malnutrition: Secondary | ICD-10-CM | POA: Diagnosis not present

## 2017-09-13 DIAGNOSIS — I10 Essential (primary) hypertension: Secondary | ICD-10-CM | POA: Diagnosis not present

## 2017-09-13 DIAGNOSIS — E46 Unspecified protein-calorie malnutrition: Secondary | ICD-10-CM | POA: Diagnosis not present

## 2017-09-13 DIAGNOSIS — N3281 Overactive bladder: Secondary | ICD-10-CM | POA: Diagnosis not present

## 2017-09-13 DIAGNOSIS — I739 Peripheral vascular disease, unspecified: Secondary | ICD-10-CM | POA: Diagnosis not present

## 2017-09-14 DIAGNOSIS — N3281 Overactive bladder: Secondary | ICD-10-CM | POA: Diagnosis not present

## 2017-09-14 DIAGNOSIS — E46 Unspecified protein-calorie malnutrition: Secondary | ICD-10-CM | POA: Diagnosis not present

## 2017-09-14 DIAGNOSIS — I739 Peripheral vascular disease, unspecified: Secondary | ICD-10-CM | POA: Diagnosis not present

## 2017-09-14 DIAGNOSIS — I10 Essential (primary) hypertension: Secondary | ICD-10-CM | POA: Diagnosis not present

## 2017-09-16 DIAGNOSIS — I10 Essential (primary) hypertension: Secondary | ICD-10-CM | POA: Diagnosis not present

## 2017-09-16 DIAGNOSIS — I739 Peripheral vascular disease, unspecified: Secondary | ICD-10-CM | POA: Diagnosis not present

## 2017-09-16 DIAGNOSIS — E46 Unspecified protein-calorie malnutrition: Secondary | ICD-10-CM | POA: Diagnosis not present

## 2017-09-16 DIAGNOSIS — N3281 Overactive bladder: Secondary | ICD-10-CM | POA: Diagnosis not present

## 2017-09-18 DIAGNOSIS — I10 Essential (primary) hypertension: Secondary | ICD-10-CM | POA: Diagnosis not present

## 2017-09-18 DIAGNOSIS — I739 Peripheral vascular disease, unspecified: Secondary | ICD-10-CM | POA: Diagnosis not present

## 2017-09-18 DIAGNOSIS — N3281 Overactive bladder: Secondary | ICD-10-CM | POA: Diagnosis not present

## 2017-09-18 DIAGNOSIS — E46 Unspecified protein-calorie malnutrition: Secondary | ICD-10-CM | POA: Diagnosis not present

## 2017-09-19 DIAGNOSIS — I10 Essential (primary) hypertension: Secondary | ICD-10-CM | POA: Diagnosis not present

## 2017-09-19 DIAGNOSIS — N3281 Overactive bladder: Secondary | ICD-10-CM | POA: Diagnosis not present

## 2017-09-19 DIAGNOSIS — E46 Unspecified protein-calorie malnutrition: Secondary | ICD-10-CM | POA: Diagnosis not present

## 2017-09-19 DIAGNOSIS — I739 Peripheral vascular disease, unspecified: Secondary | ICD-10-CM | POA: Diagnosis not present

## 2017-09-20 DIAGNOSIS — N3281 Overactive bladder: Secondary | ICD-10-CM | POA: Diagnosis not present

## 2017-09-20 DIAGNOSIS — I10 Essential (primary) hypertension: Secondary | ICD-10-CM | POA: Diagnosis not present

## 2017-09-20 DIAGNOSIS — I739 Peripheral vascular disease, unspecified: Secondary | ICD-10-CM | POA: Diagnosis not present

## 2017-09-20 DIAGNOSIS — E46 Unspecified protein-calorie malnutrition: Secondary | ICD-10-CM | POA: Diagnosis not present

## 2017-09-23 DIAGNOSIS — N3281 Overactive bladder: Secondary | ICD-10-CM | POA: Diagnosis not present

## 2017-09-23 DIAGNOSIS — I10 Essential (primary) hypertension: Secondary | ICD-10-CM | POA: Diagnosis not present

## 2017-09-23 DIAGNOSIS — E46 Unspecified protein-calorie malnutrition: Secondary | ICD-10-CM | POA: Diagnosis not present

## 2017-09-23 DIAGNOSIS — I739 Peripheral vascular disease, unspecified: Secondary | ICD-10-CM | POA: Diagnosis not present

## 2017-09-25 DIAGNOSIS — E46 Unspecified protein-calorie malnutrition: Secondary | ICD-10-CM | POA: Diagnosis not present

## 2017-09-25 DIAGNOSIS — I10 Essential (primary) hypertension: Secondary | ICD-10-CM | POA: Diagnosis not present

## 2017-09-25 DIAGNOSIS — N3281 Overactive bladder: Secondary | ICD-10-CM | POA: Diagnosis not present

## 2017-09-25 DIAGNOSIS — I739 Peripheral vascular disease, unspecified: Secondary | ICD-10-CM | POA: Diagnosis not present

## 2017-09-26 DIAGNOSIS — E46 Unspecified protein-calorie malnutrition: Secondary | ICD-10-CM | POA: Diagnosis not present

## 2017-09-26 DIAGNOSIS — I739 Peripheral vascular disease, unspecified: Secondary | ICD-10-CM | POA: Diagnosis not present

## 2017-09-26 DIAGNOSIS — I10 Essential (primary) hypertension: Secondary | ICD-10-CM | POA: Diagnosis not present

## 2017-09-26 DIAGNOSIS — N3281 Overactive bladder: Secondary | ICD-10-CM | POA: Diagnosis not present

## 2017-09-27 DIAGNOSIS — I739 Peripheral vascular disease, unspecified: Secondary | ICD-10-CM | POA: Diagnosis not present

## 2017-09-27 DIAGNOSIS — N3281 Overactive bladder: Secondary | ICD-10-CM | POA: Diagnosis not present

## 2017-09-27 DIAGNOSIS — E46 Unspecified protein-calorie malnutrition: Secondary | ICD-10-CM | POA: Diagnosis not present

## 2017-09-27 DIAGNOSIS — I10 Essential (primary) hypertension: Secondary | ICD-10-CM | POA: Diagnosis not present

## 2017-09-30 DIAGNOSIS — I10 Essential (primary) hypertension: Secondary | ICD-10-CM | POA: Diagnosis not present

## 2017-09-30 DIAGNOSIS — N3281 Overactive bladder: Secondary | ICD-10-CM | POA: Diagnosis not present

## 2017-09-30 DIAGNOSIS — E46 Unspecified protein-calorie malnutrition: Secondary | ICD-10-CM | POA: Diagnosis not present

## 2017-09-30 DIAGNOSIS — I739 Peripheral vascular disease, unspecified: Secondary | ICD-10-CM | POA: Diagnosis not present

## 2017-10-02 DIAGNOSIS — I739 Peripheral vascular disease, unspecified: Secondary | ICD-10-CM | POA: Diagnosis not present

## 2017-10-02 DIAGNOSIS — I10 Essential (primary) hypertension: Secondary | ICD-10-CM | POA: Diagnosis not present

## 2017-10-02 DIAGNOSIS — E46 Unspecified protein-calorie malnutrition: Secondary | ICD-10-CM | POA: Diagnosis not present

## 2017-10-02 DIAGNOSIS — N3281 Overactive bladder: Secondary | ICD-10-CM | POA: Diagnosis not present

## 2017-10-04 DIAGNOSIS — I739 Peripheral vascular disease, unspecified: Secondary | ICD-10-CM | POA: Diagnosis not present

## 2017-10-04 DIAGNOSIS — I10 Essential (primary) hypertension: Secondary | ICD-10-CM | POA: Diagnosis not present

## 2017-10-04 DIAGNOSIS — E46 Unspecified protein-calorie malnutrition: Secondary | ICD-10-CM | POA: Diagnosis not present

## 2017-10-04 DIAGNOSIS — N3281 Overactive bladder: Secondary | ICD-10-CM | POA: Diagnosis not present

## 2017-10-07 DIAGNOSIS — I10 Essential (primary) hypertension: Secondary | ICD-10-CM | POA: Diagnosis not present

## 2017-10-07 DIAGNOSIS — N3281 Overactive bladder: Secondary | ICD-10-CM | POA: Diagnosis not present

## 2017-10-07 DIAGNOSIS — E46 Unspecified protein-calorie malnutrition: Secondary | ICD-10-CM | POA: Diagnosis not present

## 2017-10-07 DIAGNOSIS — I739 Peripheral vascular disease, unspecified: Secondary | ICD-10-CM | POA: Diagnosis not present

## 2017-10-09 DIAGNOSIS — I10 Essential (primary) hypertension: Secondary | ICD-10-CM | POA: Diagnosis not present

## 2017-10-09 DIAGNOSIS — N3281 Overactive bladder: Secondary | ICD-10-CM | POA: Diagnosis not present

## 2017-10-09 DIAGNOSIS — E46 Unspecified protein-calorie malnutrition: Secondary | ICD-10-CM | POA: Diagnosis not present

## 2017-10-09 DIAGNOSIS — I739 Peripheral vascular disease, unspecified: Secondary | ICD-10-CM | POA: Diagnosis not present

## 2017-10-11 DIAGNOSIS — N3281 Overactive bladder: Secondary | ICD-10-CM | POA: Diagnosis not present

## 2017-10-11 DIAGNOSIS — E46 Unspecified protein-calorie malnutrition: Secondary | ICD-10-CM | POA: Diagnosis not present

## 2017-10-11 DIAGNOSIS — I10 Essential (primary) hypertension: Secondary | ICD-10-CM | POA: Diagnosis not present

## 2017-10-11 DIAGNOSIS — I739 Peripheral vascular disease, unspecified: Secondary | ICD-10-CM | POA: Diagnosis not present

## 2017-10-14 DIAGNOSIS — I739 Peripheral vascular disease, unspecified: Secondary | ICD-10-CM | POA: Diagnosis not present

## 2017-10-14 DIAGNOSIS — N3281 Overactive bladder: Secondary | ICD-10-CM | POA: Diagnosis not present

## 2017-10-14 DIAGNOSIS — E46 Unspecified protein-calorie malnutrition: Secondary | ICD-10-CM | POA: Diagnosis not present

## 2017-10-14 DIAGNOSIS — I10 Essential (primary) hypertension: Secondary | ICD-10-CM | POA: Diagnosis not present

## 2017-10-16 DIAGNOSIS — N3281 Overactive bladder: Secondary | ICD-10-CM | POA: Diagnosis not present

## 2017-10-16 DIAGNOSIS — E46 Unspecified protein-calorie malnutrition: Secondary | ICD-10-CM | POA: Diagnosis not present

## 2017-10-16 DIAGNOSIS — I10 Essential (primary) hypertension: Secondary | ICD-10-CM | POA: Diagnosis not present

## 2017-10-16 DIAGNOSIS — I739 Peripheral vascular disease, unspecified: Secondary | ICD-10-CM | POA: Diagnosis not present

## 2017-10-18 DIAGNOSIS — I10 Essential (primary) hypertension: Secondary | ICD-10-CM | POA: Diagnosis not present

## 2017-10-18 DIAGNOSIS — N3281 Overactive bladder: Secondary | ICD-10-CM | POA: Diagnosis not present

## 2017-10-18 DIAGNOSIS — E46 Unspecified protein-calorie malnutrition: Secondary | ICD-10-CM | POA: Diagnosis not present

## 2017-10-18 DIAGNOSIS — I739 Peripheral vascular disease, unspecified: Secondary | ICD-10-CM | POA: Diagnosis not present

## 2017-10-19 DIAGNOSIS — I739 Peripheral vascular disease, unspecified: Secondary | ICD-10-CM | POA: Diagnosis not present

## 2017-10-19 DIAGNOSIS — I10 Essential (primary) hypertension: Secondary | ICD-10-CM | POA: Diagnosis not present

## 2017-10-19 DIAGNOSIS — E46 Unspecified protein-calorie malnutrition: Secondary | ICD-10-CM | POA: Diagnosis not present

## 2017-10-19 DIAGNOSIS — N3281 Overactive bladder: Secondary | ICD-10-CM | POA: Diagnosis not present

## 2017-10-21 DIAGNOSIS — N3281 Overactive bladder: Secondary | ICD-10-CM | POA: Diagnosis not present

## 2017-10-21 DIAGNOSIS — E46 Unspecified protein-calorie malnutrition: Secondary | ICD-10-CM | POA: Diagnosis not present

## 2017-10-21 DIAGNOSIS — I10 Essential (primary) hypertension: Secondary | ICD-10-CM | POA: Diagnosis not present

## 2017-10-21 DIAGNOSIS — I739 Peripheral vascular disease, unspecified: Secondary | ICD-10-CM | POA: Diagnosis not present

## 2017-10-23 DIAGNOSIS — E46 Unspecified protein-calorie malnutrition: Secondary | ICD-10-CM | POA: Diagnosis not present

## 2017-10-23 DIAGNOSIS — I739 Peripheral vascular disease, unspecified: Secondary | ICD-10-CM | POA: Diagnosis not present

## 2017-10-23 DIAGNOSIS — I10 Essential (primary) hypertension: Secondary | ICD-10-CM | POA: Diagnosis not present

## 2017-10-23 DIAGNOSIS — N3281 Overactive bladder: Secondary | ICD-10-CM | POA: Diagnosis not present

## 2017-10-25 DIAGNOSIS — I10 Essential (primary) hypertension: Secondary | ICD-10-CM | POA: Diagnosis not present

## 2017-10-25 DIAGNOSIS — E46 Unspecified protein-calorie malnutrition: Secondary | ICD-10-CM | POA: Diagnosis not present

## 2017-10-25 DIAGNOSIS — N3281 Overactive bladder: Secondary | ICD-10-CM | POA: Diagnosis not present

## 2017-10-25 DIAGNOSIS — I739 Peripheral vascular disease, unspecified: Secondary | ICD-10-CM | POA: Diagnosis not present

## 2017-10-30 DIAGNOSIS — I739 Peripheral vascular disease, unspecified: Secondary | ICD-10-CM | POA: Diagnosis not present

## 2017-10-30 DIAGNOSIS — N3281 Overactive bladder: Secondary | ICD-10-CM | POA: Diagnosis not present

## 2017-10-30 DIAGNOSIS — E46 Unspecified protein-calorie malnutrition: Secondary | ICD-10-CM | POA: Diagnosis not present

## 2017-10-30 DIAGNOSIS — I10 Essential (primary) hypertension: Secondary | ICD-10-CM | POA: Diagnosis not present

## 2017-10-31 DIAGNOSIS — N3281 Overactive bladder: Secondary | ICD-10-CM | POA: Diagnosis not present

## 2017-10-31 DIAGNOSIS — I739 Peripheral vascular disease, unspecified: Secondary | ICD-10-CM | POA: Diagnosis not present

## 2017-10-31 DIAGNOSIS — E46 Unspecified protein-calorie malnutrition: Secondary | ICD-10-CM | POA: Diagnosis not present

## 2017-10-31 DIAGNOSIS — I10 Essential (primary) hypertension: Secondary | ICD-10-CM | POA: Diagnosis not present

## 2017-11-01 DIAGNOSIS — I10 Essential (primary) hypertension: Secondary | ICD-10-CM | POA: Diagnosis not present

## 2017-11-01 DIAGNOSIS — E46 Unspecified protein-calorie malnutrition: Secondary | ICD-10-CM | POA: Diagnosis not present

## 2017-11-01 DIAGNOSIS — I739 Peripheral vascular disease, unspecified: Secondary | ICD-10-CM | POA: Diagnosis not present

## 2017-11-01 DIAGNOSIS — N3281 Overactive bladder: Secondary | ICD-10-CM | POA: Diagnosis not present

## 2017-11-04 DIAGNOSIS — I10 Essential (primary) hypertension: Secondary | ICD-10-CM | POA: Diagnosis not present

## 2017-11-04 DIAGNOSIS — I739 Peripheral vascular disease, unspecified: Secondary | ICD-10-CM | POA: Diagnosis not present

## 2017-11-04 DIAGNOSIS — N3281 Overactive bladder: Secondary | ICD-10-CM | POA: Diagnosis not present

## 2017-11-04 DIAGNOSIS — E46 Unspecified protein-calorie malnutrition: Secondary | ICD-10-CM | POA: Diagnosis not present

## 2017-11-06 DIAGNOSIS — N3281 Overactive bladder: Secondary | ICD-10-CM | POA: Diagnosis not present

## 2017-11-06 DIAGNOSIS — I10 Essential (primary) hypertension: Secondary | ICD-10-CM | POA: Diagnosis not present

## 2017-11-06 DIAGNOSIS — E46 Unspecified protein-calorie malnutrition: Secondary | ICD-10-CM | POA: Diagnosis not present

## 2017-11-06 DIAGNOSIS — I739 Peripheral vascular disease, unspecified: Secondary | ICD-10-CM | POA: Diagnosis not present

## 2017-11-07 DIAGNOSIS — N3281 Overactive bladder: Secondary | ICD-10-CM | POA: Diagnosis not present

## 2017-11-07 DIAGNOSIS — E46 Unspecified protein-calorie malnutrition: Secondary | ICD-10-CM | POA: Diagnosis not present

## 2017-11-07 DIAGNOSIS — I10 Essential (primary) hypertension: Secondary | ICD-10-CM | POA: Diagnosis not present

## 2017-11-07 DIAGNOSIS — I739 Peripheral vascular disease, unspecified: Secondary | ICD-10-CM | POA: Diagnosis not present

## 2017-11-08 DIAGNOSIS — N3281 Overactive bladder: Secondary | ICD-10-CM | POA: Diagnosis not present

## 2017-11-08 DIAGNOSIS — I739 Peripheral vascular disease, unspecified: Secondary | ICD-10-CM | POA: Diagnosis not present

## 2017-11-08 DIAGNOSIS — I10 Essential (primary) hypertension: Secondary | ICD-10-CM | POA: Diagnosis not present

## 2017-11-08 DIAGNOSIS — E46 Unspecified protein-calorie malnutrition: Secondary | ICD-10-CM | POA: Diagnosis not present

## 2017-11-11 DIAGNOSIS — I10 Essential (primary) hypertension: Secondary | ICD-10-CM | POA: Diagnosis not present

## 2017-11-11 DIAGNOSIS — N3281 Overactive bladder: Secondary | ICD-10-CM | POA: Diagnosis not present

## 2017-11-11 DIAGNOSIS — E46 Unspecified protein-calorie malnutrition: Secondary | ICD-10-CM | POA: Diagnosis not present

## 2017-11-11 DIAGNOSIS — I739 Peripheral vascular disease, unspecified: Secondary | ICD-10-CM | POA: Diagnosis not present

## 2017-11-13 DIAGNOSIS — I739 Peripheral vascular disease, unspecified: Secondary | ICD-10-CM | POA: Diagnosis not present

## 2017-11-13 DIAGNOSIS — I10 Essential (primary) hypertension: Secondary | ICD-10-CM | POA: Diagnosis not present

## 2017-11-13 DIAGNOSIS — E46 Unspecified protein-calorie malnutrition: Secondary | ICD-10-CM | POA: Diagnosis not present

## 2017-11-13 DIAGNOSIS — N3281 Overactive bladder: Secondary | ICD-10-CM | POA: Diagnosis not present

## 2017-11-15 DIAGNOSIS — N3281 Overactive bladder: Secondary | ICD-10-CM | POA: Diagnosis not present

## 2017-11-15 DIAGNOSIS — E46 Unspecified protein-calorie malnutrition: Secondary | ICD-10-CM | POA: Diagnosis not present

## 2017-11-15 DIAGNOSIS — I10 Essential (primary) hypertension: Secondary | ICD-10-CM | POA: Diagnosis not present

## 2017-11-15 DIAGNOSIS — I739 Peripheral vascular disease, unspecified: Secondary | ICD-10-CM | POA: Diagnosis not present

## 2017-11-18 DIAGNOSIS — I10 Essential (primary) hypertension: Secondary | ICD-10-CM | POA: Diagnosis not present

## 2017-11-18 DIAGNOSIS — E46 Unspecified protein-calorie malnutrition: Secondary | ICD-10-CM | POA: Diagnosis not present

## 2017-11-18 DIAGNOSIS — N3281 Overactive bladder: Secondary | ICD-10-CM | POA: Diagnosis not present

## 2017-11-18 DIAGNOSIS — I739 Peripheral vascular disease, unspecified: Secondary | ICD-10-CM | POA: Diagnosis not present

## 2017-11-19 DIAGNOSIS — E46 Unspecified protein-calorie malnutrition: Secondary | ICD-10-CM | POA: Diagnosis not present

## 2017-11-19 DIAGNOSIS — N3281 Overactive bladder: Secondary | ICD-10-CM | POA: Diagnosis not present

## 2017-11-19 DIAGNOSIS — I10 Essential (primary) hypertension: Secondary | ICD-10-CM | POA: Diagnosis not present

## 2017-11-19 DIAGNOSIS — I739 Peripheral vascular disease, unspecified: Secondary | ICD-10-CM | POA: Diagnosis not present

## 2017-11-20 DIAGNOSIS — I739 Peripheral vascular disease, unspecified: Secondary | ICD-10-CM | POA: Diagnosis not present

## 2017-11-20 DIAGNOSIS — N3281 Overactive bladder: Secondary | ICD-10-CM | POA: Diagnosis not present

## 2017-11-20 DIAGNOSIS — E46 Unspecified protein-calorie malnutrition: Secondary | ICD-10-CM | POA: Diagnosis not present

## 2017-11-20 DIAGNOSIS — I10 Essential (primary) hypertension: Secondary | ICD-10-CM | POA: Diagnosis not present

## 2017-11-22 DIAGNOSIS — E46 Unspecified protein-calorie malnutrition: Secondary | ICD-10-CM | POA: Diagnosis not present

## 2017-11-22 DIAGNOSIS — N3281 Overactive bladder: Secondary | ICD-10-CM | POA: Diagnosis not present

## 2017-11-22 DIAGNOSIS — I739 Peripheral vascular disease, unspecified: Secondary | ICD-10-CM | POA: Diagnosis not present

## 2017-11-22 DIAGNOSIS — I10 Essential (primary) hypertension: Secondary | ICD-10-CM | POA: Diagnosis not present

## 2017-11-25 DIAGNOSIS — E46 Unspecified protein-calorie malnutrition: Secondary | ICD-10-CM | POA: Diagnosis not present

## 2017-11-25 DIAGNOSIS — I10 Essential (primary) hypertension: Secondary | ICD-10-CM | POA: Diagnosis not present

## 2017-11-25 DIAGNOSIS — I739 Peripheral vascular disease, unspecified: Secondary | ICD-10-CM | POA: Diagnosis not present

## 2017-11-25 DIAGNOSIS — N3281 Overactive bladder: Secondary | ICD-10-CM | POA: Diagnosis not present

## 2017-11-27 DIAGNOSIS — I739 Peripheral vascular disease, unspecified: Secondary | ICD-10-CM | POA: Diagnosis not present

## 2017-11-27 DIAGNOSIS — E46 Unspecified protein-calorie malnutrition: Secondary | ICD-10-CM | POA: Diagnosis not present

## 2017-11-27 DIAGNOSIS — I10 Essential (primary) hypertension: Secondary | ICD-10-CM | POA: Diagnosis not present

## 2017-11-27 DIAGNOSIS — N3281 Overactive bladder: Secondary | ICD-10-CM | POA: Diagnosis not present

## 2017-11-28 DIAGNOSIS — E46 Unspecified protein-calorie malnutrition: Secondary | ICD-10-CM | POA: Diagnosis not present

## 2017-11-28 DIAGNOSIS — N3281 Overactive bladder: Secondary | ICD-10-CM | POA: Diagnosis not present

## 2017-11-28 DIAGNOSIS — I739 Peripheral vascular disease, unspecified: Secondary | ICD-10-CM | POA: Diagnosis not present

## 2017-11-28 DIAGNOSIS — I10 Essential (primary) hypertension: Secondary | ICD-10-CM | POA: Diagnosis not present

## 2017-11-29 DIAGNOSIS — N3281 Overactive bladder: Secondary | ICD-10-CM | POA: Diagnosis not present

## 2017-11-29 DIAGNOSIS — I739 Peripheral vascular disease, unspecified: Secondary | ICD-10-CM | POA: Diagnosis not present

## 2017-11-29 DIAGNOSIS — I10 Essential (primary) hypertension: Secondary | ICD-10-CM | POA: Diagnosis not present

## 2017-11-29 DIAGNOSIS — E46 Unspecified protein-calorie malnutrition: Secondary | ICD-10-CM | POA: Diagnosis not present

## 2017-12-02 DIAGNOSIS — I10 Essential (primary) hypertension: Secondary | ICD-10-CM | POA: Diagnosis not present

## 2017-12-02 DIAGNOSIS — I739 Peripheral vascular disease, unspecified: Secondary | ICD-10-CM | POA: Diagnosis not present

## 2017-12-02 DIAGNOSIS — N3281 Overactive bladder: Secondary | ICD-10-CM | POA: Diagnosis not present

## 2017-12-02 DIAGNOSIS — E46 Unspecified protein-calorie malnutrition: Secondary | ICD-10-CM | POA: Diagnosis not present

## 2017-12-03 DIAGNOSIS — N3281 Overactive bladder: Secondary | ICD-10-CM | POA: Diagnosis not present

## 2017-12-03 DIAGNOSIS — I10 Essential (primary) hypertension: Secondary | ICD-10-CM | POA: Diagnosis not present

## 2017-12-03 DIAGNOSIS — E46 Unspecified protein-calorie malnutrition: Secondary | ICD-10-CM | POA: Diagnosis not present

## 2017-12-03 DIAGNOSIS — I739 Peripheral vascular disease, unspecified: Secondary | ICD-10-CM | POA: Diagnosis not present

## 2017-12-06 DIAGNOSIS — E46 Unspecified protein-calorie malnutrition: Secondary | ICD-10-CM | POA: Diagnosis not present

## 2017-12-06 DIAGNOSIS — I739 Peripheral vascular disease, unspecified: Secondary | ICD-10-CM | POA: Diagnosis not present

## 2017-12-06 DIAGNOSIS — N3281 Overactive bladder: Secondary | ICD-10-CM | POA: Diagnosis not present

## 2017-12-06 DIAGNOSIS — I10 Essential (primary) hypertension: Secondary | ICD-10-CM | POA: Diagnosis not present

## 2017-12-08 DIAGNOSIS — I10 Essential (primary) hypertension: Secondary | ICD-10-CM | POA: Diagnosis not present

## 2017-12-08 DIAGNOSIS — I739 Peripheral vascular disease, unspecified: Secondary | ICD-10-CM | POA: Diagnosis not present

## 2017-12-08 DIAGNOSIS — E46 Unspecified protein-calorie malnutrition: Secondary | ICD-10-CM | POA: Diagnosis not present

## 2017-12-08 DIAGNOSIS — N3281 Overactive bladder: Secondary | ICD-10-CM | POA: Diagnosis not present

## 2017-12-09 DIAGNOSIS — N3281 Overactive bladder: Secondary | ICD-10-CM | POA: Diagnosis not present

## 2017-12-09 DIAGNOSIS — I10 Essential (primary) hypertension: Secondary | ICD-10-CM | POA: Diagnosis not present

## 2017-12-09 DIAGNOSIS — E46 Unspecified protein-calorie malnutrition: Secondary | ICD-10-CM | POA: Diagnosis not present

## 2017-12-09 DIAGNOSIS — I739 Peripheral vascular disease, unspecified: Secondary | ICD-10-CM | POA: Diagnosis not present

## 2017-12-11 DIAGNOSIS — I739 Peripheral vascular disease, unspecified: Secondary | ICD-10-CM | POA: Diagnosis not present

## 2017-12-11 DIAGNOSIS — I10 Essential (primary) hypertension: Secondary | ICD-10-CM | POA: Diagnosis not present

## 2017-12-11 DIAGNOSIS — E46 Unspecified protein-calorie malnutrition: Secondary | ICD-10-CM | POA: Diagnosis not present

## 2017-12-11 DIAGNOSIS — N3281 Overactive bladder: Secondary | ICD-10-CM | POA: Diagnosis not present

## 2017-12-13 DIAGNOSIS — I739 Peripheral vascular disease, unspecified: Secondary | ICD-10-CM | POA: Diagnosis not present

## 2017-12-13 DIAGNOSIS — N3281 Overactive bladder: Secondary | ICD-10-CM | POA: Diagnosis not present

## 2017-12-13 DIAGNOSIS — E46 Unspecified protein-calorie malnutrition: Secondary | ICD-10-CM | POA: Diagnosis not present

## 2017-12-13 DIAGNOSIS — I10 Essential (primary) hypertension: Secondary | ICD-10-CM | POA: Diagnosis not present

## 2017-12-16 DIAGNOSIS — I10 Essential (primary) hypertension: Secondary | ICD-10-CM | POA: Diagnosis not present

## 2017-12-16 DIAGNOSIS — I739 Peripheral vascular disease, unspecified: Secondary | ICD-10-CM | POA: Diagnosis not present

## 2017-12-16 DIAGNOSIS — N3281 Overactive bladder: Secondary | ICD-10-CM | POA: Diagnosis not present

## 2017-12-16 DIAGNOSIS — E46 Unspecified protein-calorie malnutrition: Secondary | ICD-10-CM | POA: Diagnosis not present

## 2017-12-17 DIAGNOSIS — I10 Essential (primary) hypertension: Secondary | ICD-10-CM | POA: Diagnosis not present

## 2017-12-17 DIAGNOSIS — I739 Peripheral vascular disease, unspecified: Secondary | ICD-10-CM | POA: Diagnosis not present

## 2017-12-17 DIAGNOSIS — E46 Unspecified protein-calorie malnutrition: Secondary | ICD-10-CM | POA: Diagnosis not present

## 2017-12-17 DIAGNOSIS — N3281 Overactive bladder: Secondary | ICD-10-CM | POA: Diagnosis not present

## 2017-12-18 DIAGNOSIS — I10 Essential (primary) hypertension: Secondary | ICD-10-CM | POA: Diagnosis not present

## 2017-12-18 DIAGNOSIS — E46 Unspecified protein-calorie malnutrition: Secondary | ICD-10-CM | POA: Diagnosis not present

## 2017-12-18 DIAGNOSIS — N3281 Overactive bladder: Secondary | ICD-10-CM | POA: Diagnosis not present

## 2017-12-18 DIAGNOSIS — I739 Peripheral vascular disease, unspecified: Secondary | ICD-10-CM | POA: Diagnosis not present

## 2017-12-20 DIAGNOSIS — N3281 Overactive bladder: Secondary | ICD-10-CM | POA: Diagnosis not present

## 2017-12-20 DIAGNOSIS — I739 Peripheral vascular disease, unspecified: Secondary | ICD-10-CM | POA: Diagnosis not present

## 2017-12-20 DIAGNOSIS — I10 Essential (primary) hypertension: Secondary | ICD-10-CM | POA: Diagnosis not present

## 2017-12-20 DIAGNOSIS — E46 Unspecified protein-calorie malnutrition: Secondary | ICD-10-CM | POA: Diagnosis not present

## 2017-12-23 DIAGNOSIS — I10 Essential (primary) hypertension: Secondary | ICD-10-CM | POA: Diagnosis not present

## 2017-12-23 DIAGNOSIS — I739 Peripheral vascular disease, unspecified: Secondary | ICD-10-CM | POA: Diagnosis not present

## 2017-12-23 DIAGNOSIS — E46 Unspecified protein-calorie malnutrition: Secondary | ICD-10-CM | POA: Diagnosis not present

## 2017-12-23 DIAGNOSIS — N3281 Overactive bladder: Secondary | ICD-10-CM | POA: Diagnosis not present

## 2017-12-24 DIAGNOSIS — I739 Peripheral vascular disease, unspecified: Secondary | ICD-10-CM | POA: Diagnosis not present

## 2017-12-24 DIAGNOSIS — I10 Essential (primary) hypertension: Secondary | ICD-10-CM | POA: Diagnosis not present

## 2017-12-24 DIAGNOSIS — N3281 Overactive bladder: Secondary | ICD-10-CM | POA: Diagnosis not present

## 2017-12-24 DIAGNOSIS — E46 Unspecified protein-calorie malnutrition: Secondary | ICD-10-CM | POA: Diagnosis not present

## 2017-12-25 DIAGNOSIS — I10 Essential (primary) hypertension: Secondary | ICD-10-CM | POA: Diagnosis not present

## 2017-12-25 DIAGNOSIS — I739 Peripheral vascular disease, unspecified: Secondary | ICD-10-CM | POA: Diagnosis not present

## 2017-12-25 DIAGNOSIS — E46 Unspecified protein-calorie malnutrition: Secondary | ICD-10-CM | POA: Diagnosis not present

## 2017-12-25 DIAGNOSIS — N3281 Overactive bladder: Secondary | ICD-10-CM | POA: Diagnosis not present

## 2017-12-27 DIAGNOSIS — I10 Essential (primary) hypertension: Secondary | ICD-10-CM | POA: Diagnosis not present

## 2017-12-27 DIAGNOSIS — E46 Unspecified protein-calorie malnutrition: Secondary | ICD-10-CM | POA: Diagnosis not present

## 2017-12-27 DIAGNOSIS — N3281 Overactive bladder: Secondary | ICD-10-CM | POA: Diagnosis not present

## 2017-12-27 DIAGNOSIS — I739 Peripheral vascular disease, unspecified: Secondary | ICD-10-CM | POA: Diagnosis not present

## 2017-12-30 DIAGNOSIS — I10 Essential (primary) hypertension: Secondary | ICD-10-CM | POA: Diagnosis not present

## 2017-12-30 DIAGNOSIS — E46 Unspecified protein-calorie malnutrition: Secondary | ICD-10-CM | POA: Diagnosis not present

## 2017-12-30 DIAGNOSIS — N3281 Overactive bladder: Secondary | ICD-10-CM | POA: Diagnosis not present

## 2017-12-30 DIAGNOSIS — I739 Peripheral vascular disease, unspecified: Secondary | ICD-10-CM | POA: Diagnosis not present

## 2018-01-01 DIAGNOSIS — I739 Peripheral vascular disease, unspecified: Secondary | ICD-10-CM | POA: Diagnosis not present

## 2018-01-01 DIAGNOSIS — E46 Unspecified protein-calorie malnutrition: Secondary | ICD-10-CM | POA: Diagnosis not present

## 2018-01-01 DIAGNOSIS — N3281 Overactive bladder: Secondary | ICD-10-CM | POA: Diagnosis not present

## 2018-01-01 DIAGNOSIS — I10 Essential (primary) hypertension: Secondary | ICD-10-CM | POA: Diagnosis not present

## 2018-01-02 DIAGNOSIS — I10 Essential (primary) hypertension: Secondary | ICD-10-CM | POA: Diagnosis not present

## 2018-01-02 DIAGNOSIS — N3281 Overactive bladder: Secondary | ICD-10-CM | POA: Diagnosis not present

## 2018-01-02 DIAGNOSIS — E46 Unspecified protein-calorie malnutrition: Secondary | ICD-10-CM | POA: Diagnosis not present

## 2018-01-02 DIAGNOSIS — I739 Peripheral vascular disease, unspecified: Secondary | ICD-10-CM | POA: Diagnosis not present

## 2018-01-03 DIAGNOSIS — I10 Essential (primary) hypertension: Secondary | ICD-10-CM | POA: Diagnosis not present

## 2018-01-03 DIAGNOSIS — E46 Unspecified protein-calorie malnutrition: Secondary | ICD-10-CM | POA: Diagnosis not present

## 2018-01-03 DIAGNOSIS — N3281 Overactive bladder: Secondary | ICD-10-CM | POA: Diagnosis not present

## 2018-01-03 DIAGNOSIS — I739 Peripheral vascular disease, unspecified: Secondary | ICD-10-CM | POA: Diagnosis not present

## 2018-01-04 DIAGNOSIS — N3281 Overactive bladder: Secondary | ICD-10-CM | POA: Diagnosis not present

## 2018-01-04 DIAGNOSIS — I739 Peripheral vascular disease, unspecified: Secondary | ICD-10-CM | POA: Diagnosis not present

## 2018-01-04 DIAGNOSIS — E46 Unspecified protein-calorie malnutrition: Secondary | ICD-10-CM | POA: Diagnosis not present

## 2018-01-04 DIAGNOSIS — I10 Essential (primary) hypertension: Secondary | ICD-10-CM | POA: Diagnosis not present

## 2018-01-05 DIAGNOSIS — N3281 Overactive bladder: Secondary | ICD-10-CM | POA: Diagnosis not present

## 2018-01-05 DIAGNOSIS — I739 Peripheral vascular disease, unspecified: Secondary | ICD-10-CM | POA: Diagnosis not present

## 2018-01-05 DIAGNOSIS — E46 Unspecified protein-calorie malnutrition: Secondary | ICD-10-CM | POA: Diagnosis not present

## 2018-01-05 DIAGNOSIS — I10 Essential (primary) hypertension: Secondary | ICD-10-CM | POA: Diagnosis not present

## 2018-01-06 DIAGNOSIS — I10 Essential (primary) hypertension: Secondary | ICD-10-CM | POA: Diagnosis not present

## 2018-01-06 DIAGNOSIS — I739 Peripheral vascular disease, unspecified: Secondary | ICD-10-CM | POA: Diagnosis not present

## 2018-01-06 DIAGNOSIS — N3281 Overactive bladder: Secondary | ICD-10-CM | POA: Diagnosis not present

## 2018-01-06 DIAGNOSIS — E46 Unspecified protein-calorie malnutrition: Secondary | ICD-10-CM | POA: Diagnosis not present

## 2018-01-07 ENCOUNTER — Encounter: Payer: Self-pay | Admitting: Internal Medicine

## 2018-01-07 ENCOUNTER — Non-Acute Institutional Stay (SKILLED_NURSING_FACILITY): Admitting: Internal Medicine

## 2018-01-07 DIAGNOSIS — R195 Other fecal abnormalities: Secondary | ICD-10-CM | POA: Diagnosis not present

## 2018-01-07 DIAGNOSIS — I10 Essential (primary) hypertension: Secondary | ICD-10-CM

## 2018-01-07 DIAGNOSIS — N3281 Overactive bladder: Secondary | ICD-10-CM | POA: Diagnosis not present

## 2018-01-07 DIAGNOSIS — E46 Unspecified protein-calorie malnutrition: Secondary | ICD-10-CM | POA: Diagnosis not present

## 2018-01-07 DIAGNOSIS — E440 Moderate protein-calorie malnutrition: Secondary | ICD-10-CM | POA: Diagnosis not present

## 2018-01-07 DIAGNOSIS — I739 Peripheral vascular disease, unspecified: Secondary | ICD-10-CM | POA: Diagnosis not present

## 2018-01-07 NOTE — Patient Instructions (Signed)
See assessment and plan under each diagnosis in the problem list and acutely for this visit 

## 2018-01-07 NOTE — Progress Notes (Signed)
    NURSING HOME LOCATION:  [facility] ROOM NUMBER:  209-A  CODE STATUS:  DNR  PCP:  Clovis RileyMitchell, L.August Saucerean, MD  [address] [place] [id number]   This is a Respite admission note to [facility] performed on this date less than 30 days from date of admission. Included are preadmission medical/surgical history;reconciled medication list; family history; social history and comprehensive review of systems.  Corrections and additions to the records were documented. Comprehensive physical exam was also performed. Additionally a clinical summary was entered for each active diagnosis pertinent to this admission in the Problem List to enhance continuity of care.   HPI: Patient is a 84 year old female here for respite admission. The only information provided was an FL 2 form provided by Hospice. Diagnoses included protein-caloric malnutrition, essential hypertension, peripheral vascular disease, and overactive bladder.  Past medical and surgical history: includes dyslipidemia, AAA & clotting disorder. Aorto-femoral -popliteal bypass grafting   Social history:former 2 ppd X 15 years;former snuff user. No alcohol.  Family history:not on file; non contributory due to age.  Review of systems:  She denies any active complaints except for some possible diarrhea last week. The Med List includes as needed Lomotil, MiraLAX and Imodium A-D. She is also on Senokot as maintenance.  Though she makes comments that her daughters "have given up their lives for me", she denies depression. She noted last week was the 14th and February, but she was unsure of the year. She repeatedly mentions her fall in "September of last year", but she could not give me the exact year.   Physical exam:  Pertinent or positive findings: Patient is hard of hearing. She is grossly cachectic with severe muscle wasting of the head, trunk, and limbs. Ptosis greater on the right than the left. She is completely  edentulous. Wispy hirsutism is present over the chin.Heart rhythm is irregular and slow. Breath sounds are decreased.  Abdomen is doughy and protuberant. Foley catheter is in place. Fusiform changes in the knees are present, especially on the right. She is generally weak but much more so in the right lower extremity compared to the other extremities.   General appearance: no acute distress, increased work of breathing is present.   Lymphatic: No lymphadenopathy about the head, neck, axilla. Eyes: No conjunctival inflammation or lid edema is present. There is no scleral icterus. Ears:  External ear exam shows no significant lesions or deformities.   Nose:  External nasal examination shows no deformity or inflammation. Nasal mucosa are pink and moist without lesions, exudates Oral exam: Lips and gums are healthy appearing.There is no oropharyngeal erythema or exudate. Neck:  No thyromegaly, masses, tenderness noted.    Heart:  No gallop, murmur, click, rub .  Lungs: without wheezes, rhonchi, rales, rubs. Abdomen: Bowel sounds are normal.  Abdomen is soft and nontender with no organomegaly, hernias, masses. GU: Deferred  Skin: Warm & dry  No significant lesions or rash.  See clinical summary under each active problem in the Problem List with associated updated therapeutic plan

## 2018-01-07 NOTE — Assessment & Plan Note (Signed)
Consider discontinuing the Senokot and employing a trial of probiotic.

## 2018-01-07 NOTE — Assessment & Plan Note (Signed)
BP controlled; no change in antihypertensive medications  

## 2018-01-07 NOTE — Assessment & Plan Note (Signed)
Protein supplements as tolerated

## 2018-01-08 ENCOUNTER — Encounter: Payer: Self-pay | Admitting: Internal Medicine

## 2018-01-08 DIAGNOSIS — N3281 Overactive bladder: Secondary | ICD-10-CM | POA: Diagnosis not present

## 2018-01-08 DIAGNOSIS — E46 Unspecified protein-calorie malnutrition: Secondary | ICD-10-CM | POA: Diagnosis not present

## 2018-01-08 DIAGNOSIS — I10 Essential (primary) hypertension: Secondary | ICD-10-CM | POA: Diagnosis not present

## 2018-01-08 DIAGNOSIS — I739 Peripheral vascular disease, unspecified: Secondary | ICD-10-CM | POA: Diagnosis not present

## 2018-01-09 DIAGNOSIS — I10 Essential (primary) hypertension: Secondary | ICD-10-CM | POA: Diagnosis not present

## 2018-01-09 DIAGNOSIS — I739 Peripheral vascular disease, unspecified: Secondary | ICD-10-CM | POA: Diagnosis not present

## 2018-01-09 DIAGNOSIS — N3281 Overactive bladder: Secondary | ICD-10-CM | POA: Diagnosis not present

## 2018-01-09 DIAGNOSIS — E46 Unspecified protein-calorie malnutrition: Secondary | ICD-10-CM | POA: Diagnosis not present

## 2018-01-10 DIAGNOSIS — N3281 Overactive bladder: Secondary | ICD-10-CM | POA: Diagnosis not present

## 2018-01-10 DIAGNOSIS — I10 Essential (primary) hypertension: Secondary | ICD-10-CM | POA: Diagnosis not present

## 2018-01-10 DIAGNOSIS — I739 Peripheral vascular disease, unspecified: Secondary | ICD-10-CM | POA: Diagnosis not present

## 2018-01-10 DIAGNOSIS — E46 Unspecified protein-calorie malnutrition: Secondary | ICD-10-CM | POA: Diagnosis not present

## 2018-01-11 DIAGNOSIS — E46 Unspecified protein-calorie malnutrition: Secondary | ICD-10-CM | POA: Diagnosis not present

## 2018-01-11 DIAGNOSIS — I739 Peripheral vascular disease, unspecified: Secondary | ICD-10-CM | POA: Diagnosis not present

## 2018-01-11 DIAGNOSIS — N3281 Overactive bladder: Secondary | ICD-10-CM | POA: Diagnosis not present

## 2018-01-11 DIAGNOSIS — I10 Essential (primary) hypertension: Secondary | ICD-10-CM | POA: Diagnosis not present

## 2018-01-12 DIAGNOSIS — I739 Peripheral vascular disease, unspecified: Secondary | ICD-10-CM | POA: Diagnosis not present

## 2018-01-12 DIAGNOSIS — E46 Unspecified protein-calorie malnutrition: Secondary | ICD-10-CM | POA: Diagnosis not present

## 2018-01-12 DIAGNOSIS — I10 Essential (primary) hypertension: Secondary | ICD-10-CM | POA: Diagnosis not present

## 2018-01-12 DIAGNOSIS — N3281 Overactive bladder: Secondary | ICD-10-CM | POA: Diagnosis not present

## 2018-01-13 DIAGNOSIS — I739 Peripheral vascular disease, unspecified: Secondary | ICD-10-CM | POA: Diagnosis not present

## 2018-01-13 DIAGNOSIS — N3281 Overactive bladder: Secondary | ICD-10-CM | POA: Diagnosis not present

## 2018-01-13 DIAGNOSIS — I10 Essential (primary) hypertension: Secondary | ICD-10-CM | POA: Diagnosis not present

## 2018-01-13 DIAGNOSIS — E46 Unspecified protein-calorie malnutrition: Secondary | ICD-10-CM | POA: Diagnosis not present

## 2018-01-14 DIAGNOSIS — I10 Essential (primary) hypertension: Secondary | ICD-10-CM | POA: Diagnosis not present

## 2018-01-14 DIAGNOSIS — E46 Unspecified protein-calorie malnutrition: Secondary | ICD-10-CM | POA: Diagnosis not present

## 2018-01-14 DIAGNOSIS — I739 Peripheral vascular disease, unspecified: Secondary | ICD-10-CM | POA: Diagnosis not present

## 2018-01-14 DIAGNOSIS — N3281 Overactive bladder: Secondary | ICD-10-CM | POA: Diagnosis not present

## 2018-01-15 DIAGNOSIS — I10 Essential (primary) hypertension: Secondary | ICD-10-CM | POA: Diagnosis not present

## 2018-01-15 DIAGNOSIS — I739 Peripheral vascular disease, unspecified: Secondary | ICD-10-CM | POA: Diagnosis not present

## 2018-01-15 DIAGNOSIS — N3281 Overactive bladder: Secondary | ICD-10-CM | POA: Diagnosis not present

## 2018-01-15 DIAGNOSIS — E46 Unspecified protein-calorie malnutrition: Secondary | ICD-10-CM | POA: Diagnosis not present

## 2018-01-16 DIAGNOSIS — I739 Peripheral vascular disease, unspecified: Secondary | ICD-10-CM | POA: Diagnosis not present

## 2018-01-16 DIAGNOSIS — N3281 Overactive bladder: Secondary | ICD-10-CM | POA: Diagnosis not present

## 2018-01-16 DIAGNOSIS — E46 Unspecified protein-calorie malnutrition: Secondary | ICD-10-CM | POA: Diagnosis not present

## 2018-01-16 DIAGNOSIS — I10 Essential (primary) hypertension: Secondary | ICD-10-CM | POA: Diagnosis not present

## 2018-01-17 DIAGNOSIS — I739 Peripheral vascular disease, unspecified: Secondary | ICD-10-CM | POA: Diagnosis not present

## 2018-01-17 DIAGNOSIS — E46 Unspecified protein-calorie malnutrition: Secondary | ICD-10-CM | POA: Diagnosis not present

## 2018-01-17 DIAGNOSIS — I10 Essential (primary) hypertension: Secondary | ICD-10-CM | POA: Diagnosis not present

## 2018-01-17 DIAGNOSIS — N3281 Overactive bladder: Secondary | ICD-10-CM | POA: Diagnosis not present

## 2018-01-18 DIAGNOSIS — N3281 Overactive bladder: Secondary | ICD-10-CM | POA: Diagnosis not present

## 2018-01-18 DIAGNOSIS — E46 Unspecified protein-calorie malnutrition: Secondary | ICD-10-CM | POA: Diagnosis not present

## 2018-01-18 DIAGNOSIS — I10 Essential (primary) hypertension: Secondary | ICD-10-CM | POA: Diagnosis not present

## 2018-01-18 DIAGNOSIS — I739 Peripheral vascular disease, unspecified: Secondary | ICD-10-CM | POA: Diagnosis not present

## 2018-01-19 DIAGNOSIS — N3281 Overactive bladder: Secondary | ICD-10-CM | POA: Diagnosis not present

## 2018-01-19 DIAGNOSIS — I739 Peripheral vascular disease, unspecified: Secondary | ICD-10-CM | POA: Diagnosis not present

## 2018-01-19 DIAGNOSIS — I10 Essential (primary) hypertension: Secondary | ICD-10-CM | POA: Diagnosis not present

## 2018-01-19 DIAGNOSIS — E46 Unspecified protein-calorie malnutrition: Secondary | ICD-10-CM | POA: Diagnosis not present

## 2018-01-20 DIAGNOSIS — I10 Essential (primary) hypertension: Secondary | ICD-10-CM | POA: Diagnosis not present

## 2018-01-20 DIAGNOSIS — E46 Unspecified protein-calorie malnutrition: Secondary | ICD-10-CM | POA: Diagnosis not present

## 2018-01-20 DIAGNOSIS — I739 Peripheral vascular disease, unspecified: Secondary | ICD-10-CM | POA: Diagnosis not present

## 2018-01-20 DIAGNOSIS — N3281 Overactive bladder: Secondary | ICD-10-CM | POA: Diagnosis not present

## 2018-01-21 DIAGNOSIS — I739 Peripheral vascular disease, unspecified: Secondary | ICD-10-CM | POA: Diagnosis not present

## 2018-01-21 DIAGNOSIS — E46 Unspecified protein-calorie malnutrition: Secondary | ICD-10-CM | POA: Diagnosis not present

## 2018-01-21 DIAGNOSIS — N3281 Overactive bladder: Secondary | ICD-10-CM | POA: Diagnosis not present

## 2018-01-21 DIAGNOSIS — I10 Essential (primary) hypertension: Secondary | ICD-10-CM | POA: Diagnosis not present

## 2018-01-22 DIAGNOSIS — I739 Peripheral vascular disease, unspecified: Secondary | ICD-10-CM | POA: Diagnosis not present

## 2018-01-22 DIAGNOSIS — I10 Essential (primary) hypertension: Secondary | ICD-10-CM | POA: Diagnosis not present

## 2018-01-22 DIAGNOSIS — E46 Unspecified protein-calorie malnutrition: Secondary | ICD-10-CM | POA: Diagnosis not present

## 2018-01-22 DIAGNOSIS — N3281 Overactive bladder: Secondary | ICD-10-CM | POA: Diagnosis not present

## 2018-01-23 DIAGNOSIS — I10 Essential (primary) hypertension: Secondary | ICD-10-CM | POA: Diagnosis not present

## 2018-01-23 DIAGNOSIS — E46 Unspecified protein-calorie malnutrition: Secondary | ICD-10-CM | POA: Diagnosis not present

## 2018-01-23 DIAGNOSIS — N3281 Overactive bladder: Secondary | ICD-10-CM | POA: Diagnosis not present

## 2018-01-23 DIAGNOSIS — I739 Peripheral vascular disease, unspecified: Secondary | ICD-10-CM | POA: Diagnosis not present

## 2018-01-24 DIAGNOSIS — N3281 Overactive bladder: Secondary | ICD-10-CM | POA: Diagnosis not present

## 2018-01-24 DIAGNOSIS — I10 Essential (primary) hypertension: Secondary | ICD-10-CM | POA: Diagnosis not present

## 2018-01-24 DIAGNOSIS — I739 Peripheral vascular disease, unspecified: Secondary | ICD-10-CM | POA: Diagnosis not present

## 2018-01-24 DIAGNOSIS — E46 Unspecified protein-calorie malnutrition: Secondary | ICD-10-CM | POA: Diagnosis not present

## 2018-01-26 DIAGNOSIS — I10 Essential (primary) hypertension: Secondary | ICD-10-CM | POA: Diagnosis not present

## 2018-01-26 DIAGNOSIS — N3281 Overactive bladder: Secondary | ICD-10-CM | POA: Diagnosis not present

## 2018-01-26 DIAGNOSIS — I739 Peripheral vascular disease, unspecified: Secondary | ICD-10-CM | POA: Diagnosis not present

## 2018-01-26 DIAGNOSIS — E46 Unspecified protein-calorie malnutrition: Secondary | ICD-10-CM | POA: Diagnosis not present

## 2018-01-27 DIAGNOSIS — I739 Peripheral vascular disease, unspecified: Secondary | ICD-10-CM | POA: Diagnosis not present

## 2018-01-27 DIAGNOSIS — N3281 Overactive bladder: Secondary | ICD-10-CM | POA: Diagnosis not present

## 2018-01-27 DIAGNOSIS — E46 Unspecified protein-calorie malnutrition: Secondary | ICD-10-CM | POA: Diagnosis not present

## 2018-01-27 DIAGNOSIS — I10 Essential (primary) hypertension: Secondary | ICD-10-CM | POA: Diagnosis not present

## 2018-01-28 DIAGNOSIS — E46 Unspecified protein-calorie malnutrition: Secondary | ICD-10-CM | POA: Diagnosis not present

## 2018-01-28 DIAGNOSIS — N3281 Overactive bladder: Secondary | ICD-10-CM | POA: Diagnosis not present

## 2018-01-28 DIAGNOSIS — I10 Essential (primary) hypertension: Secondary | ICD-10-CM | POA: Diagnosis not present

## 2018-01-28 DIAGNOSIS — I739 Peripheral vascular disease, unspecified: Secondary | ICD-10-CM | POA: Diagnosis not present

## 2018-01-29 DIAGNOSIS — N3281 Overactive bladder: Secondary | ICD-10-CM | POA: Diagnosis not present

## 2018-01-29 DIAGNOSIS — E46 Unspecified protein-calorie malnutrition: Secondary | ICD-10-CM | POA: Diagnosis not present

## 2018-01-29 DIAGNOSIS — I739 Peripheral vascular disease, unspecified: Secondary | ICD-10-CM | POA: Diagnosis not present

## 2018-01-29 DIAGNOSIS — I10 Essential (primary) hypertension: Secondary | ICD-10-CM | POA: Diagnosis not present

## 2018-01-30 DIAGNOSIS — N3281 Overactive bladder: Secondary | ICD-10-CM | POA: Diagnosis not present

## 2018-01-30 DIAGNOSIS — E46 Unspecified protein-calorie malnutrition: Secondary | ICD-10-CM | POA: Diagnosis not present

## 2018-01-30 DIAGNOSIS — I739 Peripheral vascular disease, unspecified: Secondary | ICD-10-CM | POA: Diagnosis not present

## 2018-01-30 DIAGNOSIS — I10 Essential (primary) hypertension: Secondary | ICD-10-CM | POA: Diagnosis not present

## 2018-01-31 DIAGNOSIS — N3281 Overactive bladder: Secondary | ICD-10-CM | POA: Diagnosis not present

## 2018-01-31 DIAGNOSIS — I10 Essential (primary) hypertension: Secondary | ICD-10-CM | POA: Diagnosis not present

## 2018-01-31 DIAGNOSIS — I739 Peripheral vascular disease, unspecified: Secondary | ICD-10-CM | POA: Diagnosis not present

## 2018-01-31 DIAGNOSIS — E46 Unspecified protein-calorie malnutrition: Secondary | ICD-10-CM | POA: Diagnosis not present

## 2018-02-01 DIAGNOSIS — I739 Peripheral vascular disease, unspecified: Secondary | ICD-10-CM | POA: Diagnosis not present

## 2018-02-01 DIAGNOSIS — N3281 Overactive bladder: Secondary | ICD-10-CM | POA: Diagnosis not present

## 2018-02-01 DIAGNOSIS — I10 Essential (primary) hypertension: Secondary | ICD-10-CM | POA: Diagnosis not present

## 2018-02-01 DIAGNOSIS — E46 Unspecified protein-calorie malnutrition: Secondary | ICD-10-CM | POA: Diagnosis not present

## 2018-02-02 DIAGNOSIS — N3281 Overactive bladder: Secondary | ICD-10-CM | POA: Diagnosis not present

## 2018-02-02 DIAGNOSIS — E46 Unspecified protein-calorie malnutrition: Secondary | ICD-10-CM | POA: Diagnosis not present

## 2018-02-02 DIAGNOSIS — I10 Essential (primary) hypertension: Secondary | ICD-10-CM | POA: Diagnosis not present

## 2018-02-02 DIAGNOSIS — I739 Peripheral vascular disease, unspecified: Secondary | ICD-10-CM | POA: Diagnosis not present

## 2018-02-03 DIAGNOSIS — I10 Essential (primary) hypertension: Secondary | ICD-10-CM | POA: Diagnosis not present

## 2018-02-03 DIAGNOSIS — N3281 Overactive bladder: Secondary | ICD-10-CM | POA: Diagnosis not present

## 2018-02-03 DIAGNOSIS — E46 Unspecified protein-calorie malnutrition: Secondary | ICD-10-CM | POA: Diagnosis not present

## 2018-02-03 DIAGNOSIS — I739 Peripheral vascular disease, unspecified: Secondary | ICD-10-CM | POA: Diagnosis not present

## 2018-02-04 DIAGNOSIS — I10 Essential (primary) hypertension: Secondary | ICD-10-CM | POA: Diagnosis not present

## 2018-02-04 DIAGNOSIS — N3281 Overactive bladder: Secondary | ICD-10-CM | POA: Diagnosis not present

## 2018-02-04 DIAGNOSIS — I739 Peripheral vascular disease, unspecified: Secondary | ICD-10-CM | POA: Diagnosis not present

## 2018-02-04 DIAGNOSIS — E46 Unspecified protein-calorie malnutrition: Secondary | ICD-10-CM | POA: Diagnosis not present

## 2018-02-05 DIAGNOSIS — E46 Unspecified protein-calorie malnutrition: Secondary | ICD-10-CM | POA: Diagnosis not present

## 2018-02-05 DIAGNOSIS — I10 Essential (primary) hypertension: Secondary | ICD-10-CM | POA: Diagnosis not present

## 2018-02-05 DIAGNOSIS — N3281 Overactive bladder: Secondary | ICD-10-CM | POA: Diagnosis not present

## 2018-02-05 DIAGNOSIS — I739 Peripheral vascular disease, unspecified: Secondary | ICD-10-CM | POA: Diagnosis not present

## 2018-02-06 DIAGNOSIS — I10 Essential (primary) hypertension: Secondary | ICD-10-CM | POA: Diagnosis not present

## 2018-02-06 DIAGNOSIS — N3281 Overactive bladder: Secondary | ICD-10-CM | POA: Diagnosis not present

## 2018-02-06 DIAGNOSIS — I739 Peripheral vascular disease, unspecified: Secondary | ICD-10-CM | POA: Diagnosis not present

## 2018-02-06 DIAGNOSIS — E46 Unspecified protein-calorie malnutrition: Secondary | ICD-10-CM | POA: Diagnosis not present

## 2018-02-07 DIAGNOSIS — E46 Unspecified protein-calorie malnutrition: Secondary | ICD-10-CM | POA: Diagnosis not present

## 2018-02-07 DIAGNOSIS — N3281 Overactive bladder: Secondary | ICD-10-CM | POA: Diagnosis not present

## 2018-02-07 DIAGNOSIS — I10 Essential (primary) hypertension: Secondary | ICD-10-CM | POA: Diagnosis not present

## 2018-02-07 DIAGNOSIS — I739 Peripheral vascular disease, unspecified: Secondary | ICD-10-CM | POA: Diagnosis not present

## 2018-02-09 DIAGNOSIS — N3281 Overactive bladder: Secondary | ICD-10-CM | POA: Diagnosis not present

## 2018-02-09 DIAGNOSIS — I739 Peripheral vascular disease, unspecified: Secondary | ICD-10-CM | POA: Diagnosis not present

## 2018-02-09 DIAGNOSIS — I10 Essential (primary) hypertension: Secondary | ICD-10-CM | POA: Diagnosis not present

## 2018-02-09 DIAGNOSIS — E46 Unspecified protein-calorie malnutrition: Secondary | ICD-10-CM | POA: Diagnosis not present

## 2018-02-10 DIAGNOSIS — N3281 Overactive bladder: Secondary | ICD-10-CM | POA: Diagnosis not present

## 2018-02-10 DIAGNOSIS — I10 Essential (primary) hypertension: Secondary | ICD-10-CM | POA: Diagnosis not present

## 2018-02-10 DIAGNOSIS — E46 Unspecified protein-calorie malnutrition: Secondary | ICD-10-CM | POA: Diagnosis not present

## 2018-02-10 DIAGNOSIS — I739 Peripheral vascular disease, unspecified: Secondary | ICD-10-CM | POA: Diagnosis not present

## 2018-02-11 DIAGNOSIS — E46 Unspecified protein-calorie malnutrition: Secondary | ICD-10-CM | POA: Diagnosis not present

## 2018-02-11 DIAGNOSIS — I10 Essential (primary) hypertension: Secondary | ICD-10-CM | POA: Diagnosis not present

## 2018-02-11 DIAGNOSIS — I739 Peripheral vascular disease, unspecified: Secondary | ICD-10-CM | POA: Diagnosis not present

## 2018-02-11 DIAGNOSIS — N3281 Overactive bladder: Secondary | ICD-10-CM | POA: Diagnosis not present

## 2018-02-12 DIAGNOSIS — I10 Essential (primary) hypertension: Secondary | ICD-10-CM | POA: Diagnosis not present

## 2018-02-12 DIAGNOSIS — N3281 Overactive bladder: Secondary | ICD-10-CM | POA: Diagnosis not present

## 2018-02-12 DIAGNOSIS — I739 Peripheral vascular disease, unspecified: Secondary | ICD-10-CM | POA: Diagnosis not present

## 2018-02-12 DIAGNOSIS — E46 Unspecified protein-calorie malnutrition: Secondary | ICD-10-CM | POA: Diagnosis not present

## 2018-02-13 DIAGNOSIS — N3281 Overactive bladder: Secondary | ICD-10-CM | POA: Diagnosis not present

## 2018-02-13 DIAGNOSIS — I739 Peripheral vascular disease, unspecified: Secondary | ICD-10-CM | POA: Diagnosis not present

## 2018-02-13 DIAGNOSIS — E46 Unspecified protein-calorie malnutrition: Secondary | ICD-10-CM | POA: Diagnosis not present

## 2018-02-13 DIAGNOSIS — I10 Essential (primary) hypertension: Secondary | ICD-10-CM | POA: Diagnosis not present

## 2018-02-14 DIAGNOSIS — I10 Essential (primary) hypertension: Secondary | ICD-10-CM | POA: Diagnosis not present

## 2018-02-14 DIAGNOSIS — I739 Peripheral vascular disease, unspecified: Secondary | ICD-10-CM | POA: Diagnosis not present

## 2018-02-14 DIAGNOSIS — E46 Unspecified protein-calorie malnutrition: Secondary | ICD-10-CM | POA: Diagnosis not present

## 2018-02-14 DIAGNOSIS — N3281 Overactive bladder: Secondary | ICD-10-CM | POA: Diagnosis not present

## 2018-02-15 DIAGNOSIS — I739 Peripheral vascular disease, unspecified: Secondary | ICD-10-CM | POA: Diagnosis not present

## 2018-02-15 DIAGNOSIS — E46 Unspecified protein-calorie malnutrition: Secondary | ICD-10-CM | POA: Diagnosis not present

## 2018-02-15 DIAGNOSIS — N3281 Overactive bladder: Secondary | ICD-10-CM | POA: Diagnosis not present

## 2018-02-15 DIAGNOSIS — I10 Essential (primary) hypertension: Secondary | ICD-10-CM | POA: Diagnosis not present

## 2018-02-16 DIAGNOSIS — N3281 Overactive bladder: Secondary | ICD-10-CM | POA: Diagnosis not present

## 2018-02-16 DIAGNOSIS — I739 Peripheral vascular disease, unspecified: Secondary | ICD-10-CM | POA: Diagnosis not present

## 2018-02-16 DIAGNOSIS — E46 Unspecified protein-calorie malnutrition: Secondary | ICD-10-CM | POA: Diagnosis not present

## 2018-02-16 DIAGNOSIS — I10 Essential (primary) hypertension: Secondary | ICD-10-CM | POA: Diagnosis not present

## 2018-02-17 DIAGNOSIS — I10 Essential (primary) hypertension: Secondary | ICD-10-CM | POA: Diagnosis not present

## 2018-02-17 DIAGNOSIS — I739 Peripheral vascular disease, unspecified: Secondary | ICD-10-CM | POA: Diagnosis not present

## 2018-02-17 DIAGNOSIS — N3281 Overactive bladder: Secondary | ICD-10-CM | POA: Diagnosis not present

## 2018-02-17 DIAGNOSIS — E46 Unspecified protein-calorie malnutrition: Secondary | ICD-10-CM | POA: Diagnosis not present

## 2018-02-18 DIAGNOSIS — I10 Essential (primary) hypertension: Secondary | ICD-10-CM | POA: Diagnosis not present

## 2018-02-18 DIAGNOSIS — I739 Peripheral vascular disease, unspecified: Secondary | ICD-10-CM | POA: Diagnosis not present

## 2018-02-18 DIAGNOSIS — E46 Unspecified protein-calorie malnutrition: Secondary | ICD-10-CM | POA: Diagnosis not present

## 2018-02-18 DIAGNOSIS — N3281 Overactive bladder: Secondary | ICD-10-CM | POA: Diagnosis not present

## 2018-02-19 DIAGNOSIS — E46 Unspecified protein-calorie malnutrition: Secondary | ICD-10-CM | POA: Diagnosis not present

## 2018-02-19 DIAGNOSIS — I10 Essential (primary) hypertension: Secondary | ICD-10-CM | POA: Diagnosis not present

## 2018-02-19 DIAGNOSIS — I739 Peripheral vascular disease, unspecified: Secondary | ICD-10-CM | POA: Diagnosis not present

## 2018-02-19 DIAGNOSIS — N3281 Overactive bladder: Secondary | ICD-10-CM | POA: Diagnosis not present

## 2018-02-20 DIAGNOSIS — I10 Essential (primary) hypertension: Secondary | ICD-10-CM | POA: Diagnosis not present

## 2018-02-20 DIAGNOSIS — N3281 Overactive bladder: Secondary | ICD-10-CM | POA: Diagnosis not present

## 2018-02-20 DIAGNOSIS — I739 Peripheral vascular disease, unspecified: Secondary | ICD-10-CM | POA: Diagnosis not present

## 2018-02-20 DIAGNOSIS — E46 Unspecified protein-calorie malnutrition: Secondary | ICD-10-CM | POA: Diagnosis not present

## 2018-02-21 DIAGNOSIS — E46 Unspecified protein-calorie malnutrition: Secondary | ICD-10-CM | POA: Diagnosis not present

## 2018-02-21 DIAGNOSIS — N3281 Overactive bladder: Secondary | ICD-10-CM | POA: Diagnosis not present

## 2018-02-21 DIAGNOSIS — I739 Peripheral vascular disease, unspecified: Secondary | ICD-10-CM | POA: Diagnosis not present

## 2018-02-21 DIAGNOSIS — I10 Essential (primary) hypertension: Secondary | ICD-10-CM | POA: Diagnosis not present

## 2018-02-22 DIAGNOSIS — N3281 Overactive bladder: Secondary | ICD-10-CM | POA: Diagnosis not present

## 2018-02-22 DIAGNOSIS — E46 Unspecified protein-calorie malnutrition: Secondary | ICD-10-CM | POA: Diagnosis not present

## 2018-02-22 DIAGNOSIS — I10 Essential (primary) hypertension: Secondary | ICD-10-CM | POA: Diagnosis not present

## 2018-02-22 DIAGNOSIS — I739 Peripheral vascular disease, unspecified: Secondary | ICD-10-CM | POA: Diagnosis not present

## 2018-02-23 DIAGNOSIS — N3281 Overactive bladder: Secondary | ICD-10-CM | POA: Diagnosis not present

## 2018-02-23 DIAGNOSIS — I739 Peripheral vascular disease, unspecified: Secondary | ICD-10-CM | POA: Diagnosis not present

## 2018-02-23 DIAGNOSIS — E46 Unspecified protein-calorie malnutrition: Secondary | ICD-10-CM | POA: Diagnosis not present

## 2018-02-23 DIAGNOSIS — I10 Essential (primary) hypertension: Secondary | ICD-10-CM | POA: Diagnosis not present

## 2018-02-24 DIAGNOSIS — E46 Unspecified protein-calorie malnutrition: Secondary | ICD-10-CM | POA: Diagnosis not present

## 2018-02-24 DIAGNOSIS — I10 Essential (primary) hypertension: Secondary | ICD-10-CM | POA: Diagnosis not present

## 2018-02-24 DIAGNOSIS — I739 Peripheral vascular disease, unspecified: Secondary | ICD-10-CM | POA: Diagnosis not present

## 2018-02-24 DIAGNOSIS — N3281 Overactive bladder: Secondary | ICD-10-CM | POA: Diagnosis not present

## 2018-02-25 DIAGNOSIS — N3281 Overactive bladder: Secondary | ICD-10-CM | POA: Diagnosis not present

## 2018-02-25 DIAGNOSIS — I10 Essential (primary) hypertension: Secondary | ICD-10-CM | POA: Diagnosis not present

## 2018-02-25 DIAGNOSIS — I739 Peripheral vascular disease, unspecified: Secondary | ICD-10-CM | POA: Diagnosis not present

## 2018-02-25 DIAGNOSIS — E46 Unspecified protein-calorie malnutrition: Secondary | ICD-10-CM | POA: Diagnosis not present

## 2018-02-26 DIAGNOSIS — N3281 Overactive bladder: Secondary | ICD-10-CM | POA: Diagnosis not present

## 2018-02-26 DIAGNOSIS — I739 Peripheral vascular disease, unspecified: Secondary | ICD-10-CM | POA: Diagnosis not present

## 2018-02-26 DIAGNOSIS — E46 Unspecified protein-calorie malnutrition: Secondary | ICD-10-CM | POA: Diagnosis not present

## 2018-02-26 DIAGNOSIS — I10 Essential (primary) hypertension: Secondary | ICD-10-CM | POA: Diagnosis not present

## 2018-02-27 DIAGNOSIS — I10 Essential (primary) hypertension: Secondary | ICD-10-CM | POA: Diagnosis not present

## 2018-02-27 DIAGNOSIS — N3281 Overactive bladder: Secondary | ICD-10-CM | POA: Diagnosis not present

## 2018-02-27 DIAGNOSIS — E46 Unspecified protein-calorie malnutrition: Secondary | ICD-10-CM | POA: Diagnosis not present

## 2018-02-27 DIAGNOSIS — I739 Peripheral vascular disease, unspecified: Secondary | ICD-10-CM | POA: Diagnosis not present

## 2018-02-28 DIAGNOSIS — I10 Essential (primary) hypertension: Secondary | ICD-10-CM | POA: Diagnosis not present

## 2018-02-28 DIAGNOSIS — E46 Unspecified protein-calorie malnutrition: Secondary | ICD-10-CM | POA: Diagnosis not present

## 2018-02-28 DIAGNOSIS — N3281 Overactive bladder: Secondary | ICD-10-CM | POA: Diagnosis not present

## 2018-02-28 DIAGNOSIS — I739 Peripheral vascular disease, unspecified: Secondary | ICD-10-CM | POA: Diagnosis not present

## 2018-03-01 DIAGNOSIS — I10 Essential (primary) hypertension: Secondary | ICD-10-CM | POA: Diagnosis not present

## 2018-03-01 DIAGNOSIS — E46 Unspecified protein-calorie malnutrition: Secondary | ICD-10-CM | POA: Diagnosis not present

## 2018-03-01 DIAGNOSIS — I739 Peripheral vascular disease, unspecified: Secondary | ICD-10-CM | POA: Diagnosis not present

## 2018-03-01 DIAGNOSIS — N3281 Overactive bladder: Secondary | ICD-10-CM | POA: Diagnosis not present

## 2018-03-02 DIAGNOSIS — I739 Peripheral vascular disease, unspecified: Secondary | ICD-10-CM | POA: Diagnosis not present

## 2018-03-02 DIAGNOSIS — N3281 Overactive bladder: Secondary | ICD-10-CM | POA: Diagnosis not present

## 2018-03-02 DIAGNOSIS — E46 Unspecified protein-calorie malnutrition: Secondary | ICD-10-CM | POA: Diagnosis not present

## 2018-03-02 DIAGNOSIS — I10 Essential (primary) hypertension: Secondary | ICD-10-CM | POA: Diagnosis not present

## 2018-03-03 DIAGNOSIS — N3281 Overactive bladder: Secondary | ICD-10-CM | POA: Diagnosis not present

## 2018-03-03 DIAGNOSIS — I739 Peripheral vascular disease, unspecified: Secondary | ICD-10-CM | POA: Diagnosis not present

## 2018-03-03 DIAGNOSIS — E46 Unspecified protein-calorie malnutrition: Secondary | ICD-10-CM | POA: Diagnosis not present

## 2018-03-03 DIAGNOSIS — I10 Essential (primary) hypertension: Secondary | ICD-10-CM | POA: Diagnosis not present

## 2018-03-04 ENCOUNTER — Non-Acute Institutional Stay (SKILLED_NURSING_FACILITY): Payer: 59 | Admitting: Internal Medicine

## 2018-03-04 ENCOUNTER — Encounter: Payer: Self-pay | Admitting: Internal Medicine

## 2018-03-04 DIAGNOSIS — F339 Major depressive disorder, recurrent, unspecified: Secondary | ICD-10-CM

## 2018-03-04 DIAGNOSIS — E440 Moderate protein-calorie malnutrition: Secondary | ICD-10-CM | POA: Diagnosis not present

## 2018-03-04 DIAGNOSIS — N3281 Overactive bladder: Secondary | ICD-10-CM | POA: Diagnosis not present

## 2018-03-04 DIAGNOSIS — I739 Peripheral vascular disease, unspecified: Secondary | ICD-10-CM | POA: Diagnosis not present

## 2018-03-04 DIAGNOSIS — R1310 Dysphagia, unspecified: Secondary | ICD-10-CM

## 2018-03-04 DIAGNOSIS — I10 Essential (primary) hypertension: Secondary | ICD-10-CM | POA: Diagnosis not present

## 2018-03-04 DIAGNOSIS — E46 Unspecified protein-calorie malnutrition: Secondary | ICD-10-CM | POA: Diagnosis not present

## 2018-03-04 NOTE — Patient Instructions (Signed)
See assessment and plan under each diagnosis in the problem list and acutely for this visit 

## 2018-03-04 NOTE — Assessment & Plan Note (Addendum)
Possibly exacerbated by dysphagia Ask Hospice if Speech Therapy evaluation appropriate

## 2018-03-04 NOTE — Assessment & Plan Note (Addendum)
Consider Speech Therapy evaluation as per Hospice

## 2018-03-04 NOTE — Progress Notes (Signed)
    NURSING HOME LOCATION:  [facility] ROOM NUMBER:  204-A  CODE STATUS:  DNR  PCP:  Clovis RileyMitchell, L.August Saucerean, MD  [address] [place] [id number]   This is a comprehensive admission note to [facility] performed on this date less than 30 days from date of admission. Included are preadmission medical/surgical history;reconciled medication list; family history; social history and comprehensive review of systems.  Corrections and additions to the records were documented. Comprehensive physical exam was also performed. Additionally a clinical summary was entered for each active diagnosis pertinent to this admission in the Problem List to enhance continuity of care.  HPI: She is admitted for [facility] respite care. Her medical diagnoses include protein-caloric malnutrition, essential hypertension, vascular disease, and overactive bladder. History is limited by her profound auditory acuity deficiency. She states "can't see, can't hear, and trouble swallowing". Apparently when she eats it "won't go down" and is associated with some shortness of breath on occasion. She goes on to say "just want to be well". Upon stating this, she began to tear up. She relates all her issues to a fall in September 18. Other active complaint is pain in the left heel, she wonders if she has a spur.  Past medical and surgical history;Social history:Reviewed, unchanged  Family history:Noncontributory due to age   Physical exam:  Pertinent or positive findings:She is markedly hard of hearing. She is cachectic with obvious muscle wasting. This is most visible over the anterior chest. There is exotropia on the right. The right pupil is asymmetric related to prior cataract surgery. Her eyes are watery without associated conjunctivitis. She is completely edentulous. Slight asymmetry of the nasolabial folds with decrease on the left. Heart rhythm is irregular. Breath sounds are decreased. Foley catheter is  in place. She has trace edema at the sock line. Pedal pulses are decreased. There is scarring and stasis dermatitis changes of the lower extremities, greater on the right than the left. There is a callus without skin breakdown over the left heel medially. There is marked laxity of the SQ tissues and musculature of the extremities. She is generally markedly weak   General appearance:no acute distress, increased work of breathing is present.   Lymphatic: No lymphadenopathy about the head, neck, axilla. Eyes: No conjunctival inflammation or lid edema is present. There is no scleral icterus. Ears:  External ear exam shows no significant lesions or deformities.   Nose:  External nasal examination shows no deformity or inflammation. Nasal mucosa are pink and moist without lesions, exudates Oral exam: Lips and gums are healthy appearing.There is no oropharyngeal erythema or exudate. Neck:  No thyromegaly, masses, tenderness noted.    Heart:  No gallop, murmur, click, rub .  Lungs: without wheezes, rhonchi, rales, rubs. Abdomen: Bowel sounds are normal.  Abdomen is soft and nontender with no organomegaly, hernias, masses. GU: Deferred  Extremities:  No cyanosis, clubbing  Neurologic exam:   Balance, Rhomberg, finger to nose testing could not be completed due to clinical state Skin: Warm & dry w/o tenting. No significant rash.  See clinical summary under each active problem in the Problem List with associated updated therapeutic plan

## 2018-03-06 ENCOUNTER — Encounter: Payer: Self-pay | Admitting: Internal Medicine

## 2018-03-06 DIAGNOSIS — F339 Major depressive disorder, recurrent, unspecified: Secondary | ICD-10-CM | POA: Insufficient documentation

## 2018-03-08 DIAGNOSIS — I10 Essential (primary) hypertension: Secondary | ICD-10-CM | POA: Diagnosis not present

## 2018-03-08 DIAGNOSIS — N3281 Overactive bladder: Secondary | ICD-10-CM | POA: Diagnosis not present

## 2018-03-08 DIAGNOSIS — I739 Peripheral vascular disease, unspecified: Secondary | ICD-10-CM | POA: Diagnosis not present

## 2018-03-08 DIAGNOSIS — E46 Unspecified protein-calorie malnutrition: Secondary | ICD-10-CM | POA: Diagnosis not present

## 2018-03-09 DIAGNOSIS — N3281 Overactive bladder: Secondary | ICD-10-CM | POA: Diagnosis not present

## 2018-03-09 DIAGNOSIS — I10 Essential (primary) hypertension: Secondary | ICD-10-CM | POA: Diagnosis not present

## 2018-03-09 DIAGNOSIS — E46 Unspecified protein-calorie malnutrition: Secondary | ICD-10-CM | POA: Diagnosis not present

## 2018-03-09 DIAGNOSIS — I739 Peripheral vascular disease, unspecified: Secondary | ICD-10-CM | POA: Diagnosis not present

## 2018-03-10 DIAGNOSIS — E46 Unspecified protein-calorie malnutrition: Secondary | ICD-10-CM | POA: Diagnosis not present

## 2018-03-10 DIAGNOSIS — I10 Essential (primary) hypertension: Secondary | ICD-10-CM | POA: Diagnosis not present

## 2018-03-10 DIAGNOSIS — N3281 Overactive bladder: Secondary | ICD-10-CM | POA: Diagnosis not present

## 2018-03-10 DIAGNOSIS — I739 Peripheral vascular disease, unspecified: Secondary | ICD-10-CM | POA: Diagnosis not present

## 2018-03-11 DIAGNOSIS — N3281 Overactive bladder: Secondary | ICD-10-CM | POA: Diagnosis not present

## 2018-03-11 DIAGNOSIS — E46 Unspecified protein-calorie malnutrition: Secondary | ICD-10-CM | POA: Diagnosis not present

## 2018-03-11 DIAGNOSIS — I10 Essential (primary) hypertension: Secondary | ICD-10-CM | POA: Diagnosis not present

## 2018-03-11 DIAGNOSIS — I739 Peripheral vascular disease, unspecified: Secondary | ICD-10-CM | POA: Diagnosis not present

## 2018-03-12 DIAGNOSIS — I739 Peripheral vascular disease, unspecified: Secondary | ICD-10-CM | POA: Diagnosis not present

## 2018-03-12 DIAGNOSIS — N3281 Overactive bladder: Secondary | ICD-10-CM | POA: Diagnosis not present

## 2018-03-12 DIAGNOSIS — I10 Essential (primary) hypertension: Secondary | ICD-10-CM | POA: Diagnosis not present

## 2018-03-12 DIAGNOSIS — E46 Unspecified protein-calorie malnutrition: Secondary | ICD-10-CM | POA: Diagnosis not present

## 2018-03-13 DIAGNOSIS — N3281 Overactive bladder: Secondary | ICD-10-CM | POA: Diagnosis not present

## 2018-03-13 DIAGNOSIS — E46 Unspecified protein-calorie malnutrition: Secondary | ICD-10-CM | POA: Diagnosis not present

## 2018-03-13 DIAGNOSIS — I739 Peripheral vascular disease, unspecified: Secondary | ICD-10-CM | POA: Diagnosis not present

## 2018-03-13 DIAGNOSIS — I10 Essential (primary) hypertension: Secondary | ICD-10-CM | POA: Diagnosis not present

## 2018-03-14 DIAGNOSIS — E46 Unspecified protein-calorie malnutrition: Secondary | ICD-10-CM | POA: Diagnosis not present

## 2018-03-14 DIAGNOSIS — I10 Essential (primary) hypertension: Secondary | ICD-10-CM | POA: Diagnosis not present

## 2018-03-14 DIAGNOSIS — N3281 Overactive bladder: Secondary | ICD-10-CM | POA: Diagnosis not present

## 2018-03-14 DIAGNOSIS — I739 Peripheral vascular disease, unspecified: Secondary | ICD-10-CM | POA: Diagnosis not present

## 2018-03-15 DIAGNOSIS — E46 Unspecified protein-calorie malnutrition: Secondary | ICD-10-CM | POA: Diagnosis not present

## 2018-03-15 DIAGNOSIS — N3281 Overactive bladder: Secondary | ICD-10-CM | POA: Diagnosis not present

## 2018-03-15 DIAGNOSIS — I739 Peripheral vascular disease, unspecified: Secondary | ICD-10-CM | POA: Diagnosis not present

## 2018-03-15 DIAGNOSIS — I10 Essential (primary) hypertension: Secondary | ICD-10-CM | POA: Diagnosis not present

## 2018-03-16 DIAGNOSIS — E46 Unspecified protein-calorie malnutrition: Secondary | ICD-10-CM | POA: Diagnosis not present

## 2018-03-16 DIAGNOSIS — N3281 Overactive bladder: Secondary | ICD-10-CM | POA: Diagnosis not present

## 2018-03-16 DIAGNOSIS — I10 Essential (primary) hypertension: Secondary | ICD-10-CM | POA: Diagnosis not present

## 2018-03-16 DIAGNOSIS — I739 Peripheral vascular disease, unspecified: Secondary | ICD-10-CM | POA: Diagnosis not present

## 2018-03-17 DIAGNOSIS — I739 Peripheral vascular disease, unspecified: Secondary | ICD-10-CM | POA: Diagnosis not present

## 2018-03-17 DIAGNOSIS — E46 Unspecified protein-calorie malnutrition: Secondary | ICD-10-CM | POA: Diagnosis not present

## 2018-03-17 DIAGNOSIS — I10 Essential (primary) hypertension: Secondary | ICD-10-CM | POA: Diagnosis not present

## 2018-03-17 DIAGNOSIS — N3281 Overactive bladder: Secondary | ICD-10-CM | POA: Diagnosis not present

## 2018-03-18 DIAGNOSIS — E46 Unspecified protein-calorie malnutrition: Secondary | ICD-10-CM | POA: Diagnosis not present

## 2018-03-18 DIAGNOSIS — I10 Essential (primary) hypertension: Secondary | ICD-10-CM | POA: Diagnosis not present

## 2018-03-18 DIAGNOSIS — I739 Peripheral vascular disease, unspecified: Secondary | ICD-10-CM | POA: Diagnosis not present

## 2018-03-18 DIAGNOSIS — N3281 Overactive bladder: Secondary | ICD-10-CM | POA: Diagnosis not present

## 2018-03-19 DIAGNOSIS — N3281 Overactive bladder: Secondary | ICD-10-CM | POA: Diagnosis not present

## 2018-03-19 DIAGNOSIS — I739 Peripheral vascular disease, unspecified: Secondary | ICD-10-CM | POA: Diagnosis not present

## 2018-03-19 DIAGNOSIS — E46 Unspecified protein-calorie malnutrition: Secondary | ICD-10-CM | POA: Diagnosis not present

## 2018-03-19 DIAGNOSIS — I10 Essential (primary) hypertension: Secondary | ICD-10-CM | POA: Diagnosis not present

## 2018-03-20 DIAGNOSIS — I10 Essential (primary) hypertension: Secondary | ICD-10-CM | POA: Diagnosis not present

## 2018-03-20 DIAGNOSIS — N3281 Overactive bladder: Secondary | ICD-10-CM | POA: Diagnosis not present

## 2018-03-20 DIAGNOSIS — I739 Peripheral vascular disease, unspecified: Secondary | ICD-10-CM | POA: Diagnosis not present

## 2018-03-20 DIAGNOSIS — E46 Unspecified protein-calorie malnutrition: Secondary | ICD-10-CM | POA: Diagnosis not present

## 2018-03-21 DIAGNOSIS — N3281 Overactive bladder: Secondary | ICD-10-CM | POA: Diagnosis not present

## 2018-03-21 DIAGNOSIS — I739 Peripheral vascular disease, unspecified: Secondary | ICD-10-CM | POA: Diagnosis not present

## 2018-03-21 DIAGNOSIS — I10 Essential (primary) hypertension: Secondary | ICD-10-CM | POA: Diagnosis not present

## 2018-03-21 DIAGNOSIS — E46 Unspecified protein-calorie malnutrition: Secondary | ICD-10-CM | POA: Diagnosis not present

## 2018-03-22 DIAGNOSIS — E46 Unspecified protein-calorie malnutrition: Secondary | ICD-10-CM | POA: Diagnosis not present

## 2018-03-22 DIAGNOSIS — I10 Essential (primary) hypertension: Secondary | ICD-10-CM | POA: Diagnosis not present

## 2018-03-22 DIAGNOSIS — N3281 Overactive bladder: Secondary | ICD-10-CM | POA: Diagnosis not present

## 2018-03-22 DIAGNOSIS — I739 Peripheral vascular disease, unspecified: Secondary | ICD-10-CM | POA: Diagnosis not present

## 2018-03-23 DIAGNOSIS — N3281 Overactive bladder: Secondary | ICD-10-CM | POA: Diagnosis not present

## 2018-03-23 DIAGNOSIS — I739 Peripheral vascular disease, unspecified: Secondary | ICD-10-CM | POA: Diagnosis not present

## 2018-03-23 DIAGNOSIS — I10 Essential (primary) hypertension: Secondary | ICD-10-CM | POA: Diagnosis not present

## 2018-03-23 DIAGNOSIS — E46 Unspecified protein-calorie malnutrition: Secondary | ICD-10-CM | POA: Diagnosis not present

## 2018-03-24 DIAGNOSIS — I739 Peripheral vascular disease, unspecified: Secondary | ICD-10-CM | POA: Diagnosis not present

## 2018-03-24 DIAGNOSIS — E46 Unspecified protein-calorie malnutrition: Secondary | ICD-10-CM | POA: Diagnosis not present

## 2018-03-24 DIAGNOSIS — N3281 Overactive bladder: Secondary | ICD-10-CM | POA: Diagnosis not present

## 2018-03-24 DIAGNOSIS — I10 Essential (primary) hypertension: Secondary | ICD-10-CM | POA: Diagnosis not present

## 2018-03-25 DIAGNOSIS — E46 Unspecified protein-calorie malnutrition: Secondary | ICD-10-CM | POA: Diagnosis not present

## 2018-03-25 DIAGNOSIS — N3281 Overactive bladder: Secondary | ICD-10-CM | POA: Diagnosis not present

## 2018-03-25 DIAGNOSIS — I10 Essential (primary) hypertension: Secondary | ICD-10-CM | POA: Diagnosis not present

## 2018-03-25 DIAGNOSIS — I739 Peripheral vascular disease, unspecified: Secondary | ICD-10-CM | POA: Diagnosis not present

## 2018-03-26 DIAGNOSIS — I10 Essential (primary) hypertension: Secondary | ICD-10-CM | POA: Diagnosis not present

## 2018-03-26 DIAGNOSIS — N3281 Overactive bladder: Secondary | ICD-10-CM | POA: Diagnosis not present

## 2018-03-26 DIAGNOSIS — E46 Unspecified protein-calorie malnutrition: Secondary | ICD-10-CM | POA: Diagnosis not present

## 2018-03-26 DIAGNOSIS — I739 Peripheral vascular disease, unspecified: Secondary | ICD-10-CM | POA: Diagnosis not present

## 2018-03-27 DIAGNOSIS — E46 Unspecified protein-calorie malnutrition: Secondary | ICD-10-CM | POA: Diagnosis not present

## 2018-03-27 DIAGNOSIS — I10 Essential (primary) hypertension: Secondary | ICD-10-CM | POA: Diagnosis not present

## 2018-03-27 DIAGNOSIS — N3281 Overactive bladder: Secondary | ICD-10-CM | POA: Diagnosis not present

## 2018-03-27 DIAGNOSIS — I739 Peripheral vascular disease, unspecified: Secondary | ICD-10-CM | POA: Diagnosis not present

## 2018-03-28 DIAGNOSIS — N3281 Overactive bladder: Secondary | ICD-10-CM | POA: Diagnosis not present

## 2018-03-28 DIAGNOSIS — I10 Essential (primary) hypertension: Secondary | ICD-10-CM | POA: Diagnosis not present

## 2018-03-28 DIAGNOSIS — E46 Unspecified protein-calorie malnutrition: Secondary | ICD-10-CM | POA: Diagnosis not present

## 2018-03-28 DIAGNOSIS — I739 Peripheral vascular disease, unspecified: Secondary | ICD-10-CM | POA: Diagnosis not present

## 2018-03-29 DIAGNOSIS — I739 Peripheral vascular disease, unspecified: Secondary | ICD-10-CM | POA: Diagnosis not present

## 2018-03-29 DIAGNOSIS — I10 Essential (primary) hypertension: Secondary | ICD-10-CM | POA: Diagnosis not present

## 2018-03-29 DIAGNOSIS — E46 Unspecified protein-calorie malnutrition: Secondary | ICD-10-CM | POA: Diagnosis not present

## 2018-03-29 DIAGNOSIS — N3281 Overactive bladder: Secondary | ICD-10-CM | POA: Diagnosis not present

## 2018-03-30 DIAGNOSIS — I10 Essential (primary) hypertension: Secondary | ICD-10-CM | POA: Diagnosis not present

## 2018-03-30 DIAGNOSIS — E46 Unspecified protein-calorie malnutrition: Secondary | ICD-10-CM | POA: Diagnosis not present

## 2018-03-30 DIAGNOSIS — I739 Peripheral vascular disease, unspecified: Secondary | ICD-10-CM | POA: Diagnosis not present

## 2018-03-30 DIAGNOSIS — N3281 Overactive bladder: Secondary | ICD-10-CM | POA: Diagnosis not present

## 2018-03-31 DIAGNOSIS — I739 Peripheral vascular disease, unspecified: Secondary | ICD-10-CM | POA: Diagnosis not present

## 2018-03-31 DIAGNOSIS — N3281 Overactive bladder: Secondary | ICD-10-CM | POA: Diagnosis not present

## 2018-03-31 DIAGNOSIS — E46 Unspecified protein-calorie malnutrition: Secondary | ICD-10-CM | POA: Diagnosis not present

## 2018-03-31 DIAGNOSIS — I10 Essential (primary) hypertension: Secondary | ICD-10-CM | POA: Diagnosis not present

## 2018-04-01 DIAGNOSIS — N3281 Overactive bladder: Secondary | ICD-10-CM | POA: Diagnosis not present

## 2018-04-01 DIAGNOSIS — I739 Peripheral vascular disease, unspecified: Secondary | ICD-10-CM | POA: Diagnosis not present

## 2018-04-01 DIAGNOSIS — E46 Unspecified protein-calorie malnutrition: Secondary | ICD-10-CM | POA: Diagnosis not present

## 2018-04-01 DIAGNOSIS — I10 Essential (primary) hypertension: Secondary | ICD-10-CM | POA: Diagnosis not present

## 2018-04-02 DIAGNOSIS — I739 Peripheral vascular disease, unspecified: Secondary | ICD-10-CM | POA: Diagnosis not present

## 2018-04-02 DIAGNOSIS — E46 Unspecified protein-calorie malnutrition: Secondary | ICD-10-CM | POA: Diagnosis not present

## 2018-04-02 DIAGNOSIS — N3281 Overactive bladder: Secondary | ICD-10-CM | POA: Diagnosis not present

## 2018-04-02 DIAGNOSIS — I10 Essential (primary) hypertension: Secondary | ICD-10-CM | POA: Diagnosis not present

## 2018-04-03 DIAGNOSIS — I10 Essential (primary) hypertension: Secondary | ICD-10-CM | POA: Diagnosis not present

## 2018-04-03 DIAGNOSIS — N3281 Overactive bladder: Secondary | ICD-10-CM | POA: Diagnosis not present

## 2018-04-03 DIAGNOSIS — E46 Unspecified protein-calorie malnutrition: Secondary | ICD-10-CM | POA: Diagnosis not present

## 2018-04-03 DIAGNOSIS — I739 Peripheral vascular disease, unspecified: Secondary | ICD-10-CM | POA: Diagnosis not present

## 2018-04-04 DIAGNOSIS — I10 Essential (primary) hypertension: Secondary | ICD-10-CM | POA: Diagnosis not present

## 2018-04-04 DIAGNOSIS — N3281 Overactive bladder: Secondary | ICD-10-CM | POA: Diagnosis not present

## 2018-04-04 DIAGNOSIS — E46 Unspecified protein-calorie malnutrition: Secondary | ICD-10-CM | POA: Diagnosis not present

## 2018-04-04 DIAGNOSIS — I739 Peripheral vascular disease, unspecified: Secondary | ICD-10-CM | POA: Diagnosis not present

## 2018-04-05 DIAGNOSIS — N3281 Overactive bladder: Secondary | ICD-10-CM | POA: Diagnosis not present

## 2018-04-05 DIAGNOSIS — I10 Essential (primary) hypertension: Secondary | ICD-10-CM | POA: Diagnosis not present

## 2018-04-05 DIAGNOSIS — I739 Peripheral vascular disease, unspecified: Secondary | ICD-10-CM | POA: Diagnosis not present

## 2018-04-05 DIAGNOSIS — E46 Unspecified protein-calorie malnutrition: Secondary | ICD-10-CM | POA: Diagnosis not present

## 2018-04-06 DIAGNOSIS — N3281 Overactive bladder: Secondary | ICD-10-CM | POA: Diagnosis not present

## 2018-04-06 DIAGNOSIS — I10 Essential (primary) hypertension: Secondary | ICD-10-CM | POA: Diagnosis not present

## 2018-04-06 DIAGNOSIS — E46 Unspecified protein-calorie malnutrition: Secondary | ICD-10-CM | POA: Diagnosis not present

## 2018-04-06 DIAGNOSIS — I739 Peripheral vascular disease, unspecified: Secondary | ICD-10-CM | POA: Diagnosis not present

## 2018-04-07 DIAGNOSIS — N3281 Overactive bladder: Secondary | ICD-10-CM | POA: Diagnosis not present

## 2018-04-07 DIAGNOSIS — E46 Unspecified protein-calorie malnutrition: Secondary | ICD-10-CM | POA: Diagnosis not present

## 2018-04-07 DIAGNOSIS — I739 Peripheral vascular disease, unspecified: Secondary | ICD-10-CM | POA: Diagnosis not present

## 2018-04-07 DIAGNOSIS — I10 Essential (primary) hypertension: Secondary | ICD-10-CM | POA: Diagnosis not present

## 2018-04-08 DIAGNOSIS — E46 Unspecified protein-calorie malnutrition: Secondary | ICD-10-CM | POA: Diagnosis not present

## 2018-04-08 DIAGNOSIS — I739 Peripheral vascular disease, unspecified: Secondary | ICD-10-CM | POA: Diagnosis not present

## 2018-04-08 DIAGNOSIS — I10 Essential (primary) hypertension: Secondary | ICD-10-CM | POA: Diagnosis not present

## 2018-04-08 DIAGNOSIS — N3281 Overactive bladder: Secondary | ICD-10-CM | POA: Diagnosis not present

## 2018-04-09 DIAGNOSIS — N3281 Overactive bladder: Secondary | ICD-10-CM | POA: Diagnosis not present

## 2018-04-09 DIAGNOSIS — E46 Unspecified protein-calorie malnutrition: Secondary | ICD-10-CM | POA: Diagnosis not present

## 2018-04-09 DIAGNOSIS — I10 Essential (primary) hypertension: Secondary | ICD-10-CM | POA: Diagnosis not present

## 2018-04-09 DIAGNOSIS — I739 Peripheral vascular disease, unspecified: Secondary | ICD-10-CM | POA: Diagnosis not present

## 2018-04-10 DIAGNOSIS — N3281 Overactive bladder: Secondary | ICD-10-CM | POA: Diagnosis not present

## 2018-04-10 DIAGNOSIS — E46 Unspecified protein-calorie malnutrition: Secondary | ICD-10-CM | POA: Diagnosis not present

## 2018-04-10 DIAGNOSIS — I10 Essential (primary) hypertension: Secondary | ICD-10-CM | POA: Diagnosis not present

## 2018-04-10 DIAGNOSIS — I739 Peripheral vascular disease, unspecified: Secondary | ICD-10-CM | POA: Diagnosis not present

## 2018-04-11 DIAGNOSIS — I739 Peripheral vascular disease, unspecified: Secondary | ICD-10-CM | POA: Diagnosis not present

## 2018-04-11 DIAGNOSIS — N3281 Overactive bladder: Secondary | ICD-10-CM | POA: Diagnosis not present

## 2018-04-11 DIAGNOSIS — E46 Unspecified protein-calorie malnutrition: Secondary | ICD-10-CM | POA: Diagnosis not present

## 2018-04-11 DIAGNOSIS — I10 Essential (primary) hypertension: Secondary | ICD-10-CM | POA: Diagnosis not present

## 2018-04-12 DIAGNOSIS — E46 Unspecified protein-calorie malnutrition: Secondary | ICD-10-CM | POA: Diagnosis not present

## 2018-04-12 DIAGNOSIS — N3281 Overactive bladder: Secondary | ICD-10-CM | POA: Diagnosis not present

## 2018-04-12 DIAGNOSIS — I10 Essential (primary) hypertension: Secondary | ICD-10-CM | POA: Diagnosis not present

## 2018-04-12 DIAGNOSIS — I739 Peripheral vascular disease, unspecified: Secondary | ICD-10-CM | POA: Diagnosis not present

## 2018-04-13 DIAGNOSIS — I739 Peripheral vascular disease, unspecified: Secondary | ICD-10-CM | POA: Diagnosis not present

## 2018-04-13 DIAGNOSIS — I10 Essential (primary) hypertension: Secondary | ICD-10-CM | POA: Diagnosis not present

## 2018-04-13 DIAGNOSIS — N3281 Overactive bladder: Secondary | ICD-10-CM | POA: Diagnosis not present

## 2018-04-13 DIAGNOSIS — E46 Unspecified protein-calorie malnutrition: Secondary | ICD-10-CM | POA: Diagnosis not present

## 2018-04-14 DIAGNOSIS — N3281 Overactive bladder: Secondary | ICD-10-CM | POA: Diagnosis not present

## 2018-04-14 DIAGNOSIS — I10 Essential (primary) hypertension: Secondary | ICD-10-CM | POA: Diagnosis not present

## 2018-04-14 DIAGNOSIS — E46 Unspecified protein-calorie malnutrition: Secondary | ICD-10-CM | POA: Diagnosis not present

## 2018-04-14 DIAGNOSIS — I739 Peripheral vascular disease, unspecified: Secondary | ICD-10-CM | POA: Diagnosis not present

## 2018-04-15 DIAGNOSIS — N3281 Overactive bladder: Secondary | ICD-10-CM | POA: Diagnosis not present

## 2018-04-15 DIAGNOSIS — I10 Essential (primary) hypertension: Secondary | ICD-10-CM | POA: Diagnosis not present

## 2018-04-15 DIAGNOSIS — I739 Peripheral vascular disease, unspecified: Secondary | ICD-10-CM | POA: Diagnosis not present

## 2018-04-15 DIAGNOSIS — E46 Unspecified protein-calorie malnutrition: Secondary | ICD-10-CM | POA: Diagnosis not present

## 2018-04-16 DIAGNOSIS — N3281 Overactive bladder: Secondary | ICD-10-CM | POA: Diagnosis not present

## 2018-04-16 DIAGNOSIS — I10 Essential (primary) hypertension: Secondary | ICD-10-CM | POA: Diagnosis not present

## 2018-04-16 DIAGNOSIS — E46 Unspecified protein-calorie malnutrition: Secondary | ICD-10-CM | POA: Diagnosis not present

## 2018-04-16 DIAGNOSIS — I739 Peripheral vascular disease, unspecified: Secondary | ICD-10-CM | POA: Diagnosis not present

## 2018-04-17 DIAGNOSIS — N3281 Overactive bladder: Secondary | ICD-10-CM | POA: Diagnosis not present

## 2018-04-17 DIAGNOSIS — I10 Essential (primary) hypertension: Secondary | ICD-10-CM | POA: Diagnosis not present

## 2018-04-17 DIAGNOSIS — E46 Unspecified protein-calorie malnutrition: Secondary | ICD-10-CM | POA: Diagnosis not present

## 2018-04-17 DIAGNOSIS — I739 Peripheral vascular disease, unspecified: Secondary | ICD-10-CM | POA: Diagnosis not present

## 2018-04-18 DIAGNOSIS — I739 Peripheral vascular disease, unspecified: Secondary | ICD-10-CM | POA: Diagnosis not present

## 2018-04-18 DIAGNOSIS — N3281 Overactive bladder: Secondary | ICD-10-CM | POA: Diagnosis not present

## 2018-04-18 DIAGNOSIS — I10 Essential (primary) hypertension: Secondary | ICD-10-CM | POA: Diagnosis not present

## 2018-04-18 DIAGNOSIS — E46 Unspecified protein-calorie malnutrition: Secondary | ICD-10-CM | POA: Diagnosis not present

## 2018-04-19 DIAGNOSIS — I10 Essential (primary) hypertension: Secondary | ICD-10-CM | POA: Diagnosis not present

## 2018-04-19 DIAGNOSIS — E46 Unspecified protein-calorie malnutrition: Secondary | ICD-10-CM | POA: Diagnosis not present

## 2018-04-19 DIAGNOSIS — I739 Peripheral vascular disease, unspecified: Secondary | ICD-10-CM | POA: Diagnosis not present

## 2018-04-19 DIAGNOSIS — N3281 Overactive bladder: Secondary | ICD-10-CM | POA: Diagnosis not present

## 2018-04-20 DIAGNOSIS — N3281 Overactive bladder: Secondary | ICD-10-CM | POA: Diagnosis not present

## 2018-04-20 DIAGNOSIS — I10 Essential (primary) hypertension: Secondary | ICD-10-CM | POA: Diagnosis not present

## 2018-04-20 DIAGNOSIS — E46 Unspecified protein-calorie malnutrition: Secondary | ICD-10-CM | POA: Diagnosis not present

## 2018-04-20 DIAGNOSIS — I739 Peripheral vascular disease, unspecified: Secondary | ICD-10-CM | POA: Diagnosis not present

## 2018-04-21 DIAGNOSIS — N3281 Overactive bladder: Secondary | ICD-10-CM | POA: Diagnosis not present

## 2018-04-21 DIAGNOSIS — I739 Peripheral vascular disease, unspecified: Secondary | ICD-10-CM | POA: Diagnosis not present

## 2018-04-21 DIAGNOSIS — E46 Unspecified protein-calorie malnutrition: Secondary | ICD-10-CM | POA: Diagnosis not present

## 2018-04-21 DIAGNOSIS — I10 Essential (primary) hypertension: Secondary | ICD-10-CM | POA: Diagnosis not present

## 2018-04-22 DIAGNOSIS — N3281 Overactive bladder: Secondary | ICD-10-CM | POA: Diagnosis not present

## 2018-04-22 DIAGNOSIS — E46 Unspecified protein-calorie malnutrition: Secondary | ICD-10-CM | POA: Diagnosis not present

## 2018-04-22 DIAGNOSIS — I739 Peripheral vascular disease, unspecified: Secondary | ICD-10-CM | POA: Diagnosis not present

## 2018-04-22 DIAGNOSIS — I10 Essential (primary) hypertension: Secondary | ICD-10-CM | POA: Diagnosis not present

## 2018-04-23 DIAGNOSIS — I739 Peripheral vascular disease, unspecified: Secondary | ICD-10-CM | POA: Diagnosis not present

## 2018-04-23 DIAGNOSIS — E46 Unspecified protein-calorie malnutrition: Secondary | ICD-10-CM | POA: Diagnosis not present

## 2018-04-23 DIAGNOSIS — N3281 Overactive bladder: Secondary | ICD-10-CM | POA: Diagnosis not present

## 2018-04-23 DIAGNOSIS — I10 Essential (primary) hypertension: Secondary | ICD-10-CM | POA: Diagnosis not present

## 2018-04-24 DIAGNOSIS — I10 Essential (primary) hypertension: Secondary | ICD-10-CM | POA: Diagnosis not present

## 2018-04-24 DIAGNOSIS — E46 Unspecified protein-calorie malnutrition: Secondary | ICD-10-CM | POA: Diagnosis not present

## 2018-04-24 DIAGNOSIS — I739 Peripheral vascular disease, unspecified: Secondary | ICD-10-CM | POA: Diagnosis not present

## 2018-04-24 DIAGNOSIS — N3281 Overactive bladder: Secondary | ICD-10-CM | POA: Diagnosis not present

## 2018-04-25 DIAGNOSIS — N3281 Overactive bladder: Secondary | ICD-10-CM | POA: Diagnosis not present

## 2018-04-25 DIAGNOSIS — I739 Peripheral vascular disease, unspecified: Secondary | ICD-10-CM | POA: Diagnosis not present

## 2018-04-25 DIAGNOSIS — E46 Unspecified protein-calorie malnutrition: Secondary | ICD-10-CM | POA: Diagnosis not present

## 2018-04-25 DIAGNOSIS — I10 Essential (primary) hypertension: Secondary | ICD-10-CM | POA: Diagnosis not present

## 2018-04-26 DIAGNOSIS — E46 Unspecified protein-calorie malnutrition: Secondary | ICD-10-CM | POA: Diagnosis not present

## 2018-04-26 DIAGNOSIS — N3281 Overactive bladder: Secondary | ICD-10-CM | POA: Diagnosis not present

## 2018-04-26 DIAGNOSIS — I739 Peripheral vascular disease, unspecified: Secondary | ICD-10-CM | POA: Diagnosis not present

## 2018-04-26 DIAGNOSIS — I10 Essential (primary) hypertension: Secondary | ICD-10-CM | POA: Diagnosis not present

## 2018-04-27 DIAGNOSIS — N3281 Overactive bladder: Secondary | ICD-10-CM | POA: Diagnosis not present

## 2018-04-27 DIAGNOSIS — I10 Essential (primary) hypertension: Secondary | ICD-10-CM | POA: Diagnosis not present

## 2018-04-27 DIAGNOSIS — I739 Peripheral vascular disease, unspecified: Secondary | ICD-10-CM | POA: Diagnosis not present

## 2018-04-27 DIAGNOSIS — E46 Unspecified protein-calorie malnutrition: Secondary | ICD-10-CM | POA: Diagnosis not present

## 2018-04-28 DIAGNOSIS — I10 Essential (primary) hypertension: Secondary | ICD-10-CM | POA: Diagnosis not present

## 2018-04-28 DIAGNOSIS — E46 Unspecified protein-calorie malnutrition: Secondary | ICD-10-CM | POA: Diagnosis not present

## 2018-04-28 DIAGNOSIS — N3281 Overactive bladder: Secondary | ICD-10-CM | POA: Diagnosis not present

## 2018-04-28 DIAGNOSIS — I739 Peripheral vascular disease, unspecified: Secondary | ICD-10-CM | POA: Diagnosis not present

## 2018-05-19 DEATH — deceased
